# Patient Record
Sex: Female | Born: 1940
Health system: Southern US, Community
[De-identification: ages and names within clinical notes are randomized; demographics above are authoritative.]

## PROBLEM LIST (undated history)

## (undated) DIAGNOSIS — K219 Gastro-esophageal reflux disease without esophagitis: Secondary | ICD-10-CM

## (undated) DIAGNOSIS — B029 Zoster without complications: Secondary | ICD-10-CM

## (undated) DIAGNOSIS — M1990 Unspecified osteoarthritis, unspecified site: Secondary | ICD-10-CM

## (undated) DIAGNOSIS — C801 Malignant (primary) neoplasm, unspecified: Secondary | ICD-10-CM

## (undated) DIAGNOSIS — K5520 Angiodysplasia of colon without hemorrhage: Secondary | ICD-10-CM

## (undated) DIAGNOSIS — D509 Iron deficiency anemia, unspecified: Secondary | ICD-10-CM

## (undated) DIAGNOSIS — I10 Essential (primary) hypertension: Secondary | ICD-10-CM

## (undated) DIAGNOSIS — G610 Guillain-Barre syndrome: Secondary | ICD-10-CM

## (undated) DIAGNOSIS — K589 Irritable bowel syndrome without diarrhea: Secondary | ICD-10-CM

## (undated) HISTORY — PX: CHOLECYSTECTOMY: SHX55

## (undated) HISTORY — DX: Angiodysplasia of colon without hemorrhage: K55.20

## (undated) HISTORY — PX: CERVICAL POLYPECTOMY: SHX88

## (undated) HISTORY — DX: Iron deficiency anemia, unspecified: D50.9

## (undated) HISTORY — DX: Irritable bowel syndrome, unspecified: K58.9

## (undated) HISTORY — PX: BREAST EXCISIONAL BIOPSY: SUR124

---

## 1970-12-29 HISTORY — PX: TUBAL LIGATION: SHX77

## 1977-12-29 HISTORY — PX: ABDOMINAL HYSTERECTOMY: SHX81

## 1980-12-29 HISTORY — PX: BREAST SURGERY: SHX581

## 1985-12-29 HISTORY — PX: BLADDER SUSPENSION: SHX72

## 1998-03-15 ENCOUNTER — Ambulatory Visit (HOSPITAL_COMMUNITY): Admission: RE | Admit: 1998-03-15 | Discharge: 1998-03-15 | Payer: Self-pay | Admitting: *Deleted

## 1999-07-12 ENCOUNTER — Other Ambulatory Visit: Admission: RE | Admit: 1999-07-12 | Discharge: 1999-07-12 | Payer: Self-pay | Admitting: *Deleted

## 2000-04-30 ENCOUNTER — Ambulatory Visit (HOSPITAL_COMMUNITY): Admission: RE | Admit: 2000-04-30 | Discharge: 2000-04-30 | Payer: Self-pay | Admitting: *Deleted

## 2000-04-30 ENCOUNTER — Encounter: Payer: Self-pay | Admitting: *Deleted

## 2001-07-02 ENCOUNTER — Encounter: Payer: Self-pay | Admitting: *Deleted

## 2001-07-02 ENCOUNTER — Ambulatory Visit (HOSPITAL_COMMUNITY): Admission: RE | Admit: 2001-07-02 | Discharge: 2001-07-02 | Payer: Self-pay | Admitting: *Deleted

## 2002-08-16 ENCOUNTER — Encounter: Payer: Self-pay | Admitting: *Deleted

## 2002-08-16 ENCOUNTER — Ambulatory Visit (HOSPITAL_COMMUNITY): Admission: RE | Admit: 2002-08-16 | Discharge: 2002-08-16 | Payer: Self-pay | Admitting: *Deleted

## 2002-09-16 ENCOUNTER — Other Ambulatory Visit: Admission: RE | Admit: 2002-09-16 | Discharge: 2002-09-16 | Payer: Self-pay | Admitting: *Deleted

## 2003-09-15 ENCOUNTER — Encounter: Payer: Self-pay | Admitting: *Deleted

## 2003-09-15 ENCOUNTER — Ambulatory Visit (HOSPITAL_COMMUNITY): Admission: RE | Admit: 2003-09-15 | Discharge: 2003-09-15 | Payer: Self-pay | Admitting: *Deleted

## 2004-11-28 ENCOUNTER — Ambulatory Visit (HOSPITAL_COMMUNITY): Admission: RE | Admit: 2004-11-28 | Discharge: 2004-11-28 | Payer: Self-pay | Admitting: *Deleted

## 2005-12-16 ENCOUNTER — Ambulatory Visit (HOSPITAL_COMMUNITY): Admission: RE | Admit: 2005-12-16 | Discharge: 2005-12-16 | Payer: Self-pay | Admitting: *Deleted

## 2007-03-30 ENCOUNTER — Ambulatory Visit (HOSPITAL_COMMUNITY): Admission: RE | Admit: 2007-03-30 | Discharge: 2007-03-30 | Payer: Self-pay | Admitting: Family Medicine

## 2008-04-07 ENCOUNTER — Ambulatory Visit (HOSPITAL_COMMUNITY): Admission: RE | Admit: 2008-04-07 | Discharge: 2008-04-07 | Payer: Self-pay | Admitting: Family Medicine

## 2009-04-11 ENCOUNTER — Ambulatory Visit (HOSPITAL_COMMUNITY): Admission: RE | Admit: 2009-04-11 | Discharge: 2009-04-11 | Payer: Self-pay | Admitting: Family Medicine

## 2010-04-25 ENCOUNTER — Ambulatory Visit (HOSPITAL_COMMUNITY): Admission: RE | Admit: 2010-04-25 | Discharge: 2010-04-25 | Payer: Self-pay | Admitting: Family Medicine

## 2011-04-15 ENCOUNTER — Other Ambulatory Visit (HOSPITAL_COMMUNITY): Payer: Self-pay | Admitting: Family Medicine

## 2011-04-15 DIAGNOSIS — Z1231 Encounter for screening mammogram for malignant neoplasm of breast: Secondary | ICD-10-CM

## 2011-04-28 ENCOUNTER — Ambulatory Visit (HOSPITAL_COMMUNITY)
Admission: RE | Admit: 2011-04-28 | Discharge: 2011-04-28 | Disposition: A | Discharge status: Home / Self Care | Payer: Medicare Other | Source: Ambulatory Visit | Attending: Family Medicine | Admitting: Family Medicine

## 2011-04-28 DIAGNOSIS — Z1231 Encounter for screening mammogram for malignant neoplasm of breast: Secondary | ICD-10-CM | POA: Insufficient documentation

## 2012-04-21 ENCOUNTER — Other Ambulatory Visit (HOSPITAL_COMMUNITY): Payer: Self-pay | Admitting: Family Medicine

## 2012-04-21 DIAGNOSIS — Z1231 Encounter for screening mammogram for malignant neoplasm of breast: Secondary | ICD-10-CM

## 2012-05-06 ENCOUNTER — Ambulatory Visit
Admission: RE | Admit: 2012-05-06 | Discharge: 2012-05-06 | Disposition: A | Discharge status: Home / Self Care | Payer: Medicare Other | Source: Ambulatory Visit | Attending: Family Medicine | Admitting: Family Medicine

## 2012-05-06 DIAGNOSIS — Z1231 Encounter for screening mammogram for malignant neoplasm of breast: Secondary | ICD-10-CM

## 2012-10-15 ENCOUNTER — Ambulatory Visit (INDEPENDENT_AMBULATORY_CARE_PROVIDER_SITE_OTHER): Payer: Medicare Other | Admitting: Internal Medicine

## 2012-10-15 DIAGNOSIS — Z789 Other specified health status: Secondary | ICD-10-CM

## 2012-10-15 MED ORDER — AZITHROMYCIN 500 MG PO TABS: 500.0000 mg | ORAL_TABLET | Freq: Every day | ORAL | Status: DC

## 2012-10-15 MED ORDER — ATOVAQUONE-PROGUANIL HCL 250-100 MG PO TABS: 1.0000 | ORAL_TABLET | Freq: Every day | ORAL | Status: DC

## 2012-10-15 NOTE — Progress Notes (Signed)
  Subjective:    Patient ID: Taylor Kent, female    DOB: Dec 20, 1941, 71 y.o.   MRN: [id number]  HPI 71 yo Female with no significant past medical history, she is going to [place] with her daughter , to [place], [place], doing [place] 3 day safari, trip will be 9 days long.  Flying through [place].  All: sulfa - hives  Previously well traveled, [place], [place], [place], [place], [place]  Received flu vaccine this year  Review of Systems     Objective:   Physical Exam        Assessment & Plan:  Pre-travel counseling: we discussed malaria prevention with use of premethrin and deet spray   Traveller's diarrhea= gave rx for azithro, and instructions how to use incase of TD. Also recommended pepto bismal; gave handout sheet   Malaria proph = gave malarone rx #18 . Instructed to start 2 days prior to going into area until 7 days after.   Pre-travel vaccines = did not need anything, recommended to get typhoid if they do more adventurous travel

## 2012-10-18 ENCOUNTER — Other Ambulatory Visit: Payer: Self-pay | Admitting: Internal Medicine

## 2012-10-19 ENCOUNTER — Other Ambulatory Visit: Payer: Self-pay | Admitting: *Deleted

## 2012-10-19 DIAGNOSIS — Z789 Other specified health status: Secondary | ICD-10-CM

## 2012-10-19 MED ORDER — ATOVAQUONE-PROGUANIL HCL 250-100 MG PO TABS: 1.0000 | ORAL_TABLET | Freq: Every day | ORAL | Status: DC

## 2012-10-19 MED ORDER — AZITHROMYCIN 500 MG PO TABS: 500.0000 mg | ORAL_TABLET | Freq: Every day | ORAL | Status: DC

## 2013-04-18 ENCOUNTER — Encounter: Payer: Self-pay | Admitting: *Deleted

## 2013-06-27 ENCOUNTER — Other Ambulatory Visit (HOSPITAL_COMMUNITY): Payer: Self-pay | Admitting: Family Medicine

## 2013-06-27 DIAGNOSIS — Z1231 Encounter for screening mammogram for malignant neoplasm of breast: Secondary | ICD-10-CM

## 2013-06-28 ENCOUNTER — Ambulatory Visit (HOSPITAL_COMMUNITY)
Admission: RE | Admit: 2013-06-28 | Discharge: 2013-06-28 | Disposition: A | Discharge status: Home / Self Care | Payer: Medicare Other | Source: Ambulatory Visit | Attending: Family Medicine | Admitting: Family Medicine

## 2013-06-28 DIAGNOSIS — Z1231 Encounter for screening mammogram for malignant neoplasm of breast: Secondary | ICD-10-CM

## 2013-11-03 ENCOUNTER — Other Ambulatory Visit: Payer: Self-pay

## 2013-12-29 DIAGNOSIS — B029 Zoster without complications: Secondary | ICD-10-CM

## 2013-12-29 DIAGNOSIS — G610 Guillain-Barre syndrome: Secondary | ICD-10-CM

## 2013-12-29 HISTORY — DX: Guillain-Barre syndrome: G61.0

## 2013-12-29 HISTORY — DX: Zoster without complications: B02.9

## 2014-05-31 ENCOUNTER — Other Ambulatory Visit (HOSPITAL_COMMUNITY): Payer: Self-pay | Admitting: Family Medicine

## 2014-05-31 DIAGNOSIS — Z1231 Encounter for screening mammogram for malignant neoplasm of breast: Secondary | ICD-10-CM

## 2014-06-29 ENCOUNTER — Ambulatory Visit (HOSPITAL_COMMUNITY)
Admission: RE | Admit: 2014-06-29 | Discharge: 2014-06-29 | Disposition: A | Discharge status: Home / Self Care | Payer: Medicare Other | Source: Ambulatory Visit | Attending: Family Medicine | Admitting: Family Medicine

## 2014-06-29 DIAGNOSIS — Z1231 Encounter for screening mammogram for malignant neoplasm of breast: Secondary | ICD-10-CM | POA: Insufficient documentation

## 2015-06-18 ENCOUNTER — Other Ambulatory Visit (HOSPITAL_COMMUNITY): Payer: Self-pay | Admitting: Family Medicine

## 2015-06-18 DIAGNOSIS — Z1231 Encounter for screening mammogram for malignant neoplasm of breast: Secondary | ICD-10-CM

## 2015-06-28 DIAGNOSIS — H43393 Other vitreous opacities, bilateral: Secondary | ICD-10-CM | POA: Diagnosis not present

## 2015-06-28 DIAGNOSIS — H52223 Regular astigmatism, bilateral: Secondary | ICD-10-CM | POA: Diagnosis not present

## 2015-06-28 DIAGNOSIS — H5203 Hypermetropia, bilateral: Secondary | ICD-10-CM | POA: Diagnosis not present

## 2015-06-28 DIAGNOSIS — H2513 Age-related nuclear cataract, bilateral: Secondary | ICD-10-CM | POA: Diagnosis not present

## 2015-06-28 DIAGNOSIS — H524 Presbyopia: Secondary | ICD-10-CM | POA: Diagnosis not present

## 2015-06-28 DIAGNOSIS — H35372 Puckering of macula, left eye: Secondary | ICD-10-CM | POA: Diagnosis not present

## 2015-07-03 ENCOUNTER — Ambulatory Visit (HOSPITAL_COMMUNITY)
Admission: RE | Admit: 2015-07-03 | Discharge: 2015-07-03 | Disposition: A | Discharge status: Home / Self Care | Payer: Medicare Other | Source: Ambulatory Visit | Attending: Family Medicine | Admitting: Family Medicine

## 2015-07-03 ENCOUNTER — Other Ambulatory Visit (HOSPITAL_COMMUNITY): Payer: Self-pay | Admitting: Family Medicine

## 2015-07-03 DIAGNOSIS — Z1231 Encounter for screening mammogram for malignant neoplasm of breast: Secondary | ICD-10-CM

## 2015-07-09 DIAGNOSIS — E559 Vitamin D deficiency, unspecified: Secondary | ICD-10-CM | POA: Diagnosis not present

## 2015-07-09 DIAGNOSIS — E039 Hypothyroidism, unspecified: Secondary | ICD-10-CM | POA: Diagnosis not present

## 2015-07-09 DIAGNOSIS — I10 Essential (primary) hypertension: Secondary | ICD-10-CM | POA: Diagnosis not present

## 2015-07-09 DIAGNOSIS — Z79899 Other long term (current) drug therapy: Secondary | ICD-10-CM | POA: Diagnosis not present

## 2015-07-09 DIAGNOSIS — M545 Low back pain: Secondary | ICD-10-CM | POA: Diagnosis not present

## 2015-07-09 DIAGNOSIS — E780 Pure hypercholesterolemia: Secondary | ICD-10-CM | POA: Diagnosis not present

## 2015-07-09 DIAGNOSIS — R5383 Other fatigue: Secondary | ICD-10-CM | POA: Diagnosis not present

## 2015-07-13 ENCOUNTER — Other Ambulatory Visit (HOSPITAL_COMMUNITY): Payer: Self-pay | Admitting: Internal Medicine

## 2015-07-13 DIAGNOSIS — G729 Myopathy, unspecified: Secondary | ICD-10-CM

## 2015-07-13 DIAGNOSIS — R29898 Other symptoms and signs involving the musculoskeletal system: Secondary | ICD-10-CM

## 2015-07-23 ENCOUNTER — Ambulatory Visit (HOSPITAL_COMMUNITY)
Admission: RE | Admit: 2015-07-23 | Discharge: 2015-07-23 | Disposition: A | Discharge status: Home / Self Care | Payer: Medicare Other | Source: Ambulatory Visit | Attending: Internal Medicine | Admitting: Internal Medicine

## 2015-07-23 ENCOUNTER — Other Ambulatory Visit (HOSPITAL_COMMUNITY): Payer: Self-pay | Admitting: Internal Medicine

## 2015-07-23 DIAGNOSIS — R0989 Other specified symptoms and signs involving the circulatory and respiratory systems: Secondary | ICD-10-CM | POA: Diagnosis not present

## 2015-07-23 DIAGNOSIS — S83282A Other tear of lateral meniscus, current injury, left knee, initial encounter: Secondary | ICD-10-CM | POA: Diagnosis not present

## 2015-07-23 DIAGNOSIS — M25561 Pain in right knee: Secondary | ICD-10-CM

## 2015-07-23 DIAGNOSIS — M47816 Spondylosis without myelopathy or radiculopathy, lumbar region: Secondary | ICD-10-CM | POA: Diagnosis not present

## 2015-07-23 DIAGNOSIS — M25462 Effusion, left knee: Secondary | ICD-10-CM | POA: Insufficient documentation

## 2015-07-23 DIAGNOSIS — M67461 Ganglion, right knee: Secondary | ICD-10-CM | POA: Insufficient documentation

## 2015-07-23 DIAGNOSIS — S83271A Complex tear of lateral meniscus, current injury, right knee, initial encounter: Secondary | ICD-10-CM | POA: Diagnosis not present

## 2015-07-23 DIAGNOSIS — M1711 Unilateral primary osteoarthritis, right knee: Secondary | ICD-10-CM | POA: Diagnosis not present

## 2015-07-23 DIAGNOSIS — M170 Bilateral primary osteoarthritis of knee: Secondary | ICD-10-CM | POA: Diagnosis not present

## 2015-07-23 DIAGNOSIS — R9389 Abnormal findings on diagnostic imaging of other specified body structures: Secondary | ICD-10-CM

## 2015-07-23 DIAGNOSIS — M5136 Other intervertebral disc degeneration, lumbar region: Secondary | ICD-10-CM | POA: Diagnosis not present

## 2015-07-23 DIAGNOSIS — M7642 Tibial collateral bursitis [Pellegrini-Stieda], left leg: Secondary | ICD-10-CM | POA: Diagnosis not present

## 2015-07-23 DIAGNOSIS — M479 Spondylosis, unspecified: Secondary | ICD-10-CM | POA: Insufficient documentation

## 2015-07-23 DIAGNOSIS — M6752 Plica syndrome, left knee: Secondary | ICD-10-CM | POA: Insufficient documentation

## 2015-07-23 DIAGNOSIS — M7121 Synovial cyst of popliteal space [Baker], right knee: Secondary | ICD-10-CM | POA: Insufficient documentation

## 2015-07-23 DIAGNOSIS — X58XXXA Exposure to other specified factors, initial encounter: Secondary | ICD-10-CM | POA: Insufficient documentation

## 2015-07-23 DIAGNOSIS — M25461 Effusion, right knee: Secondary | ICD-10-CM | POA: Insufficient documentation

## 2015-07-23 DIAGNOSIS — M5126 Other intervertebral disc displacement, lumbar region: Secondary | ICD-10-CM | POA: Diagnosis not present

## 2015-07-23 DIAGNOSIS — S83242A Other tear of medial meniscus, current injury, left knee, initial encounter: Secondary | ICD-10-CM | POA: Insufficient documentation

## 2015-07-23 DIAGNOSIS — M419 Scoliosis, unspecified: Secondary | ICD-10-CM | POA: Diagnosis not present

## 2015-07-23 DIAGNOSIS — R52 Pain, unspecified: Secondary | ICD-10-CM

## 2015-07-23 DIAGNOSIS — G729 Myopathy, unspecified: Secondary | ICD-10-CM | POA: Diagnosis not present

## 2015-07-23 DIAGNOSIS — M25562 Pain in left knee: Secondary | ICD-10-CM

## 2015-07-23 DIAGNOSIS — M23322 Other meniscus derangements, posterior horn of medial meniscus, left knee: Secondary | ICD-10-CM | POA: Diagnosis not present

## 2015-07-23 DIAGNOSIS — R29898 Other symptoms and signs involving the musculoskeletal system: Secondary | ICD-10-CM

## 2015-07-23 DIAGNOSIS — M23352 Other meniscus derangements, posterior horn of lateral meniscus, left knee: Secondary | ICD-10-CM | POA: Diagnosis not present

## 2015-07-25 DIAGNOSIS — M5116 Intervertebral disc disorders with radiculopathy, lumbar region: Secondary | ICD-10-CM | POA: Diagnosis not present

## 2015-07-25 DIAGNOSIS — M9903 Segmental and somatic dysfunction of lumbar region: Secondary | ICD-10-CM | POA: Diagnosis not present

## 2015-07-30 DIAGNOSIS — M9903 Segmental and somatic dysfunction of lumbar region: Secondary | ICD-10-CM | POA: Diagnosis not present

## 2015-07-30 DIAGNOSIS — M5116 Intervertebral disc disorders with radiculopathy, lumbar region: Secondary | ICD-10-CM | POA: Diagnosis not present

## 2015-08-01 DIAGNOSIS — M5116 Intervertebral disc disorders with radiculopathy, lumbar region: Secondary | ICD-10-CM | POA: Diagnosis not present

## 2015-08-01 DIAGNOSIS — M9903 Segmental and somatic dysfunction of lumbar region: Secondary | ICD-10-CM | POA: Diagnosis not present

## 2015-08-06 DIAGNOSIS — M5116 Intervertebral disc disorders with radiculopathy, lumbar region: Secondary | ICD-10-CM | POA: Diagnosis not present

## 2015-08-06 DIAGNOSIS — M9903 Segmental and somatic dysfunction of lumbar region: Secondary | ICD-10-CM | POA: Diagnosis not present

## 2015-08-08 DIAGNOSIS — M5116 Intervertebral disc disorders with radiculopathy, lumbar region: Secondary | ICD-10-CM | POA: Diagnosis not present

## 2015-08-08 DIAGNOSIS — M9903 Segmental and somatic dysfunction of lumbar region: Secondary | ICD-10-CM | POA: Diagnosis not present

## 2015-12-12 DIAGNOSIS — E7800 Pure hypercholesterolemia, unspecified: Secondary | ICD-10-CM | POA: Diagnosis not present

## 2015-12-12 DIAGNOSIS — M545 Low back pain: Secondary | ICD-10-CM | POA: Diagnosis not present

## 2015-12-12 DIAGNOSIS — E039 Hypothyroidism, unspecified: Secondary | ICD-10-CM | POA: Diagnosis not present

## 2015-12-12 DIAGNOSIS — Z79899 Other long term (current) drug therapy: Secondary | ICD-10-CM | POA: Diagnosis not present

## 2015-12-12 DIAGNOSIS — I10 Essential (primary) hypertension: Secondary | ICD-10-CM | POA: Diagnosis not present

## 2015-12-12 DIAGNOSIS — E2839 Other primary ovarian failure: Secondary | ICD-10-CM | POA: Diagnosis not present

## 2015-12-12 DIAGNOSIS — E559 Vitamin D deficiency, unspecified: Secondary | ICD-10-CM | POA: Diagnosis not present

## 2015-12-12 DIAGNOSIS — R5383 Other fatigue: Secondary | ICD-10-CM | POA: Diagnosis not present

## 2016-05-12 DIAGNOSIS — M25561 Pain in right knee: Secondary | ICD-10-CM | POA: Diagnosis not present

## 2016-05-12 DIAGNOSIS — M25562 Pain in left knee: Secondary | ICD-10-CM | POA: Diagnosis not present

## 2016-07-02 ENCOUNTER — Other Ambulatory Visit: Payer: Self-pay | Admitting: Internal Medicine

## 2016-07-02 DIAGNOSIS — Z1231 Encounter for screening mammogram for malignant neoplasm of breast: Secondary | ICD-10-CM

## 2016-07-04 ENCOUNTER — Ambulatory Visit
Admission: RE | Admit: 2016-07-04 | Discharge: 2016-07-04 | Disposition: A | Discharge status: Home / Self Care | Payer: Medicare Other | Source: Ambulatory Visit | Attending: Internal Medicine | Admitting: Internal Medicine

## 2016-07-04 DIAGNOSIS — Z1231 Encounter for screening mammogram for malignant neoplasm of breast: Secondary | ICD-10-CM

## 2016-08-26 DIAGNOSIS — M1711 Unilateral primary osteoarthritis, right knee: Secondary | ICD-10-CM | POA: Diagnosis not present

## 2016-12-05 DIAGNOSIS — Z79899 Other long term (current) drug therapy: Secondary | ICD-10-CM | POA: Diagnosis not present

## 2016-12-05 DIAGNOSIS — M545 Low back pain: Secondary | ICD-10-CM | POA: Diagnosis not present

## 2016-12-05 DIAGNOSIS — R5383 Other fatigue: Secondary | ICD-10-CM | POA: Diagnosis not present

## 2016-12-05 DIAGNOSIS — I10 Essential (primary) hypertension: Secondary | ICD-10-CM | POA: Diagnosis not present

## 2016-12-05 DIAGNOSIS — E7800 Pure hypercholesterolemia, unspecified: Secondary | ICD-10-CM | POA: Diagnosis not present

## 2016-12-05 DIAGNOSIS — E039 Hypothyroidism, unspecified: Secondary | ICD-10-CM | POA: Diagnosis not present

## 2016-12-05 DIAGNOSIS — E2839 Other primary ovarian failure: Secondary | ICD-10-CM | POA: Diagnosis not present

## 2016-12-05 DIAGNOSIS — E559 Vitamin D deficiency, unspecified: Secondary | ICD-10-CM | POA: Diagnosis not present

## 2016-12-19 IMAGING — MR MR KNEE*L* W/O CM
4 of 5 series · 18 of 40 positions shown · non-contrast
Comparison: None.

CLINICAL DATA: Bilateral lower extremity weakness for 1 year. Pain
in both knees.

EXAM:
MRI OF THE LEFT KNEE WITHOUT CONTRAST
TECHNIQUE: Multiplanar, multisequence MR imaging of the knee was performed. No
intravenous contrast was administered.

[Series 4: PD fat-sat · axial · 3.0mm · 0.31mm/px · z∈[-35,+105]mm · 9 of 41 slices shown (1 of 3)]
[im 1/41]
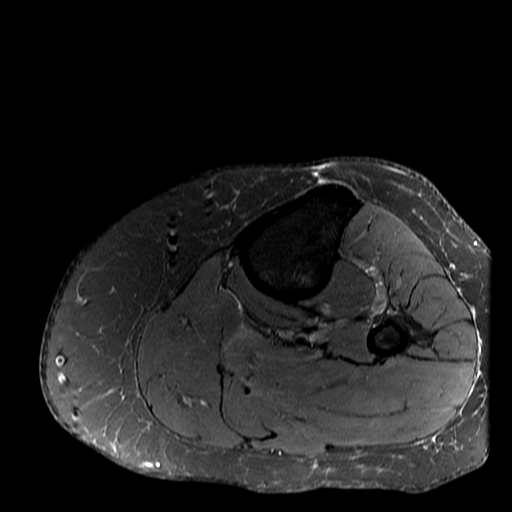
[im 6/41]
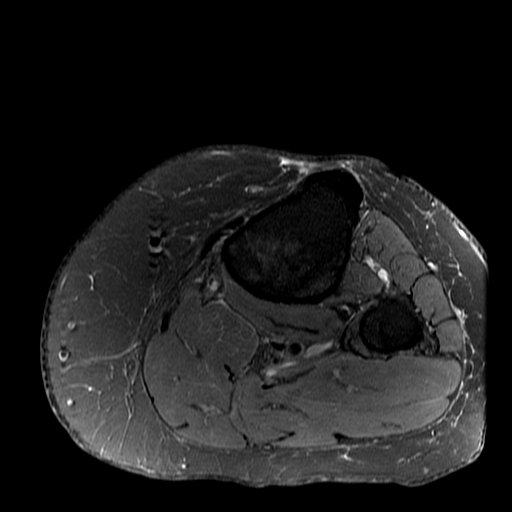
[im 11/41]
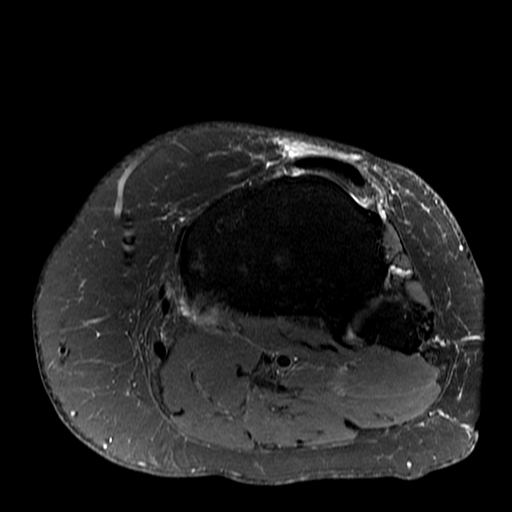
[im 16/41]
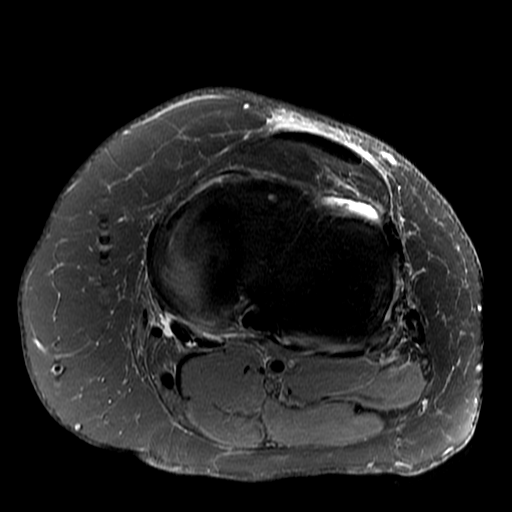
[im 21/41]
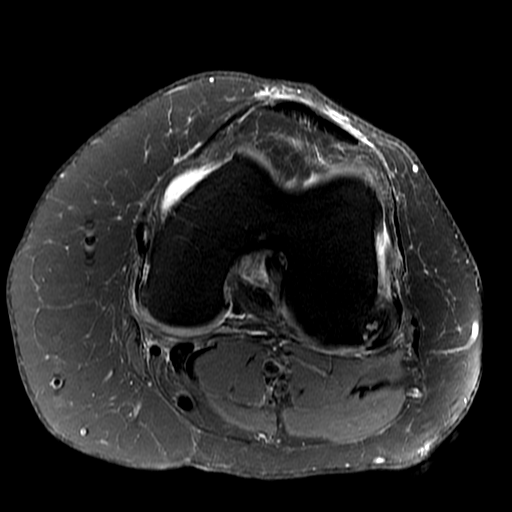
[im 26/41]
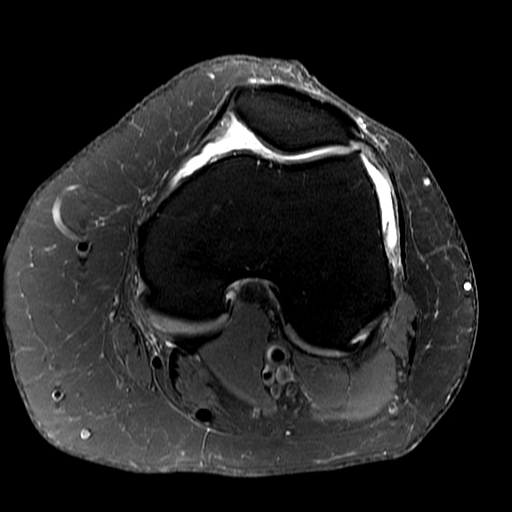
[im 31/41]
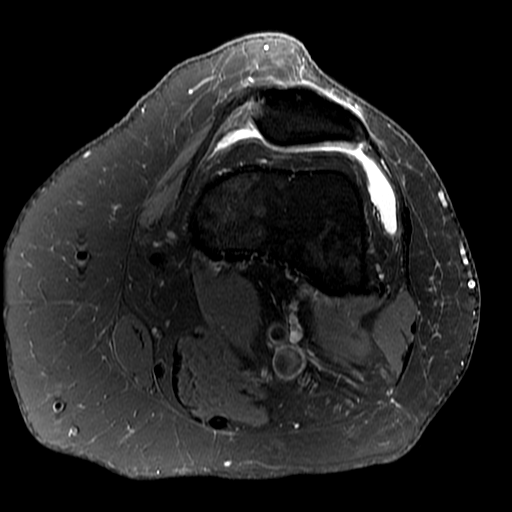
[im 36/41]
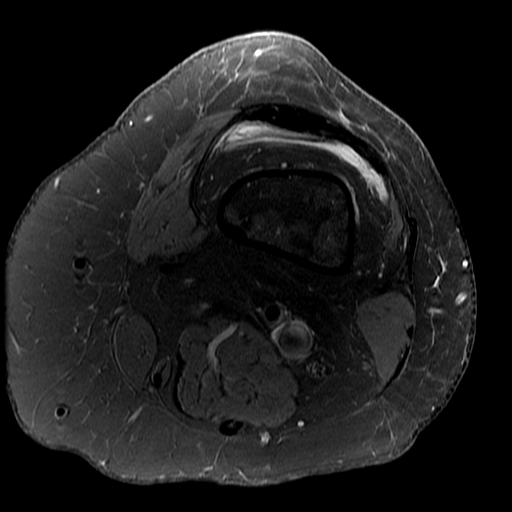
[im 41/41]
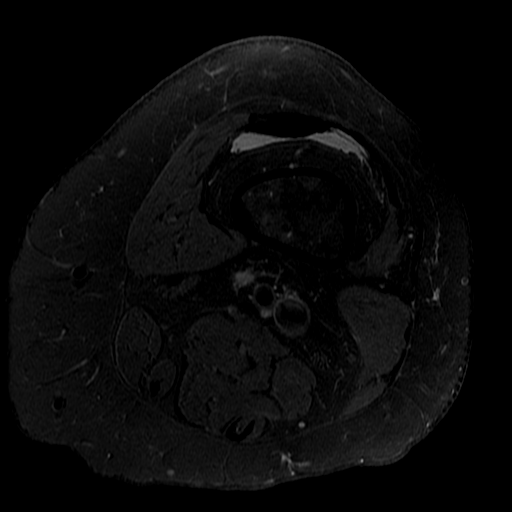

[Series 6: PD fat-sat · coronal · 3.0mm · 0.31mm/px · 3 of 42 slices shown (2 of 3)]
[im 6/42]
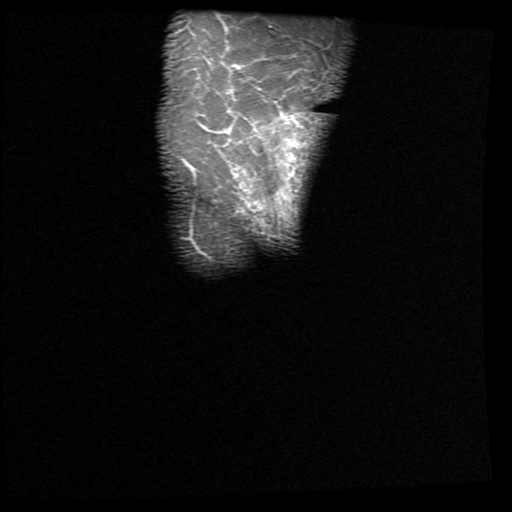
[im 24/42]
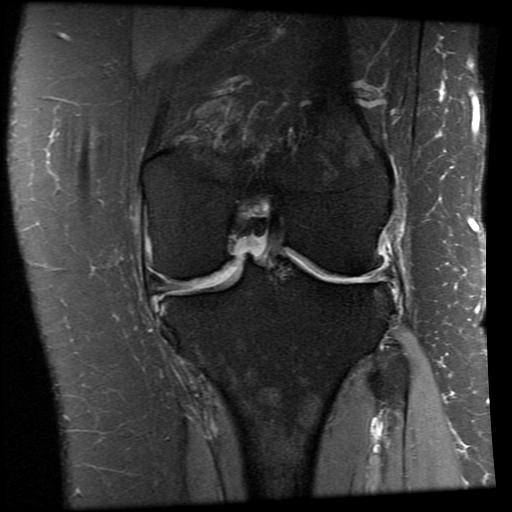
[im 36/42]
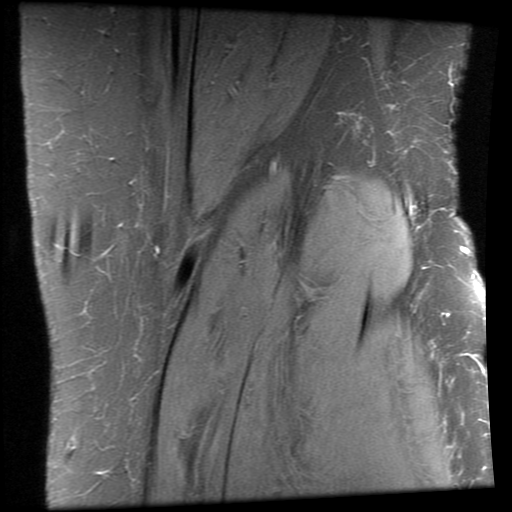

[Series 7: PD fat-sat · sagittal · 3.0mm · 0.29mm/px · 3 of 34 slices shown (3 of 3)]
[im 6/34]
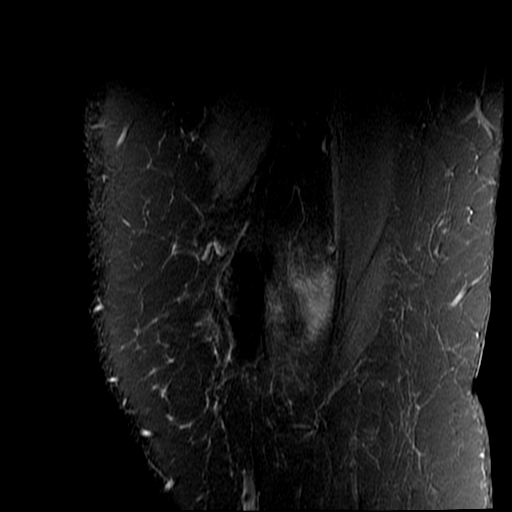
[im 17/34]
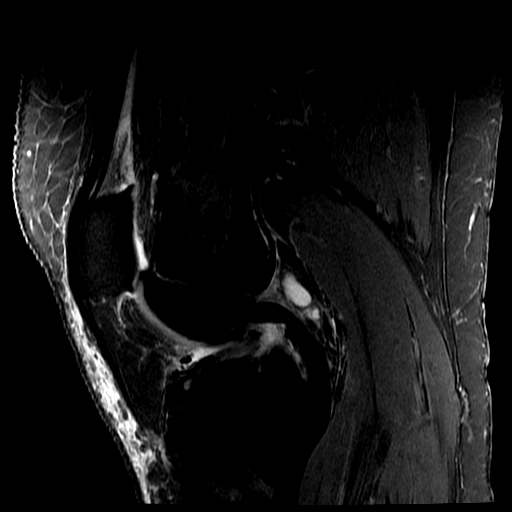
[im 28/34]
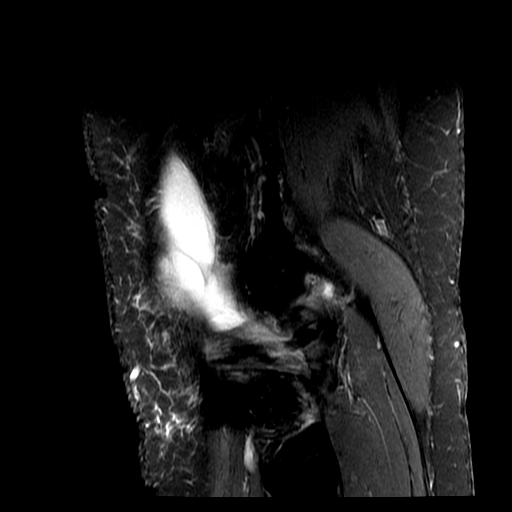

[Series 8: T2 fat-sat · coronal · 3.0mm · 0.31mm/px · 3 of 42 slices shown]
[im 6/42]
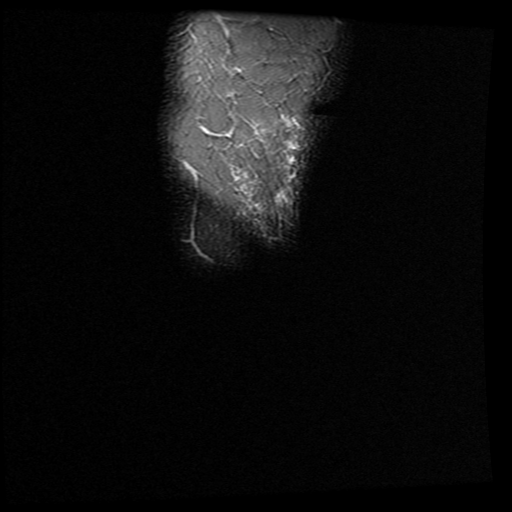
[im 24/42]
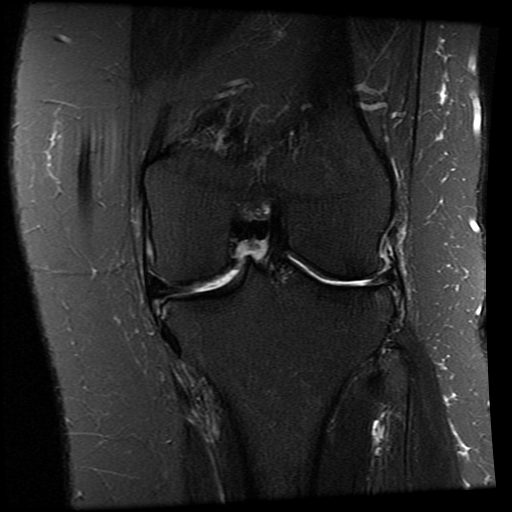
[im 36/42]
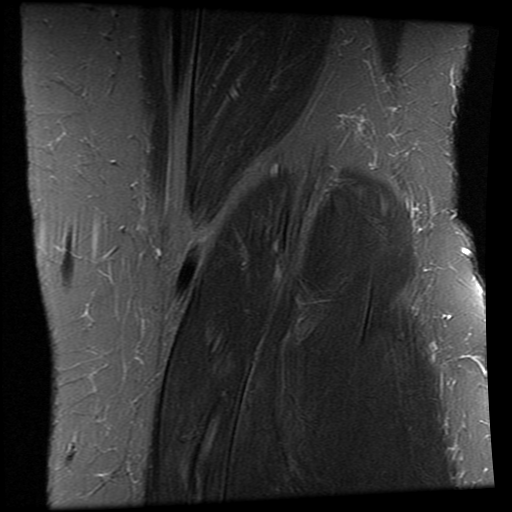

[18 of 40 positions shown; findings below may reference images not displayed]

FINDINGS: MENISCI

Medial meniscus: Radial tear posterior horn medial meniscus near the
meniscal root with irregular free edge component extending
longitudinally in the adjacent segment of the posterior horn to the
level of the meniscal midbody.

Lateral meniscus: Radial tear of the posterior horn lateral meniscus
with diminutive adjacent posterior horn and midbody segments, and
inferior surface tearing in the anterior horn lateral meniscus.

LIGAMENTS

Cruciates:  Unremarkable

Collaterals: Proximal popliteus tendinopathy. Partial tearing of the
medial collateral ligament proximally.

CARTILAGE

Patellofemoral: Full-thickness loss of articular cartilage along a
significant portion of the patellar articular surface, with
prominent loss of articular cartilage in the femoral trochlear
groove.

Medial: Severe full-thickness loss of much of the articular
cartilage in the medial compartment.

Lateral: Moderate chondral thinning with moderate chondral
heterogeneity.

Joint:  Small knee effusion.  Superior plica.

Popliteal Fossa: Semimembranosus-tibial collateral ligament
bursitis.

Extensor Mechanism: Subtle linear increased signal in the distal
quadriceps tendon is likely incidental but in the appropriate
clinical setting could represent a quadriceps sprain.

Bones: Confluent degenerative subcortical cystic lesions posteriorly
along the medial tibial plateau. Tricompartmental spurring. Focal
subcortical sclerosis along the lateral femoral condyle, likely
degenerative.
IMPRESSION: 1. Radial tear posterior horn medial meniscus with longitudinal
extension along the free edge.
2. Radial tear posterior horn lateral meniscus with diminutive
adjacent posterior horn and midbody, and inferior surface tearing in
the anterior horn lateral meniscus.
3. Partially torn MCL proximally.
4. Proximal popliteus tendinopathy.
5. Severe osteoarthritis.
6. Small knee effusion with superior plica.
7. Mild semimembranosus -tibial collateral ligament bursitis.
8. Subtle linear increased signal in the distal quadriceps tendon is
likely incidental but in the appropriate clinical setting could
represent a quadriceps sprain.

## 2017-02-06 DIAGNOSIS — M25561 Pain in right knee: Secondary | ICD-10-CM | POA: Diagnosis not present

## 2017-02-25 DIAGNOSIS — M9903 Segmental and somatic dysfunction of lumbar region: Secondary | ICD-10-CM | POA: Diagnosis not present

## 2017-02-25 DIAGNOSIS — M5116 Intervertebral disc disorders with radiculopathy, lumbar region: Secondary | ICD-10-CM | POA: Diagnosis not present

## 2017-03-23 DIAGNOSIS — M25561 Pain in right knee: Secondary | ICD-10-CM | POA: Diagnosis not present

## 2017-03-23 NOTE — H&P (Signed)
TOTAL KNEE ADMISSION H&P  Patient is being admitted for right total knee arthroplasty.  Subjective:  Chief Complaint:right knee pain.  HPI: Taylor Kent, 76 y.o. female, has a history of pain and functional disability in the right knee due to arthritis and has failed non-surgical conservative treatments for greater than 12 weeks to includeNSAID's and/or analgesics and corticosteriod injections.  Onset of symptoms was gradual, starting 6 years ago with gradually worsening course since that time. The patient noted no past surgery on the right knee(s).  Patient currently rates pain in the right knee(s) at 2 out of 10 with activity. Patient has night pain, worsening of pain with activity and weight bearing and pain that interferes with activities of daily living.  Patient has evidence of subchondral sclerosis and joint space narrowing by imaging studies. There is no active infection.  There are no active problems to display for this patient.  No past medical history on file.  No past surgical history on file.  No prescriptions prior to admission.   Allergies  Allergen Reactions  . Sulfa Antibiotics Hives    Social History  Substance Use Topics  . Smoking status: Not on file  . Smokeless tobacco: Not on file  . Alcohol use Not on file    No family history on file.   Review of Systems  Constitutional: Negative.   HENT: Negative.   Eyes: Negative.   Respiratory: Negative.   Cardiovascular: Negative.   Gastrointestinal: Negative.   Genitourinary: Negative.   Musculoskeletal: Positive for joint pain.  Skin: Negative.   Neurological: Negative.   Endo/Heme/Allergies: Negative.   Psychiatric/Behavioral: Negative.     Objective:  Physical Exam  Constitutional: She is oriented to person, place, and time. She appears well-developed and well-nourished.  HENT:  Head: Normocephalic and atraumatic.  Eyes: EOM are normal. Pupils are equal, round, and reactive to light.  Neck: Neck  supple.  Cardiovascular: Normal rate and regular rhythm.   Respiratory: Effort normal and breath sounds normal.  GI: Soft. Bowel sounds are normal.  Musculoskeletal:  Antalgic gait on the right where she has valgus thrust.  Both knees have considerable patellofemoral crepitus, but again, little symptoms on the left.  Right knee valgus beyond 12 degrees, a little bit correctable.  She still has full extension, better than 110 degrees of flexion.  Stable ligaments.  Neurovascularly intact distally.  Negative log roll of both hips.  Negative straight leg raise, both sides.    Neurological: She is alert and oriented to person, place, and time.  Skin: Skin is warm and dry.  Psychiatric: She has a normal mood and affect. Her behavior is normal. Judgment and thought content normal.    Vital signs in last 24 hours:    Labs:   There is no height or weight on file to calculate BMI.   Imaging Review Plain radiographs demonstrate severe degenerative joint disease of the right knee(s). The overall alignment ismild valgus. The bone quality appears to be fair for age and reported activity level.  Assessment/Plan:  End stage arthritis, right knee   The patient history, physical examination, clinical judgment of the provider and imaging studies are consistent with end stage degenerative joint disease of the right knee(s) and total knee arthroplasty is deemed medically necessary. The treatment options including medical management, injection therapy arthroscopy and arthroplasty were discussed at length. The risks and benefits of total knee arthroplasty were presented and reviewed. The risks due to aseptic loosening, infection, stiffness, patella tracking problems,  thromboembolic complications and other imponderables were discussed. The patient acknowledged the explanation, agreed to proceed with the plan and consent was signed. Patient is being admitted for inpatient treatment for surgery, pain control, PT,  OT, prophylactic antibiotics, VTE prophylaxis, progressive ambulation and ADL's and discharge planning. The patient is planning to be discharged home with home health services

## 2017-03-27 NOTE — Pre-Procedure Instructions (Addendum)
    Taylor Kent  03/27/2017      [facility] # [place], [place] [address] Phone: [phone] Fax: [phone]  [facility] [place], [place] - [address] [place] [address] [place] [address] Phone: [phone] Fax: [phone]   Your procedure is scheduled on Wed. Apr. 11 at 145 PM. Report to [place] [facility] at 1145 AM.  Call this number if you have problems the morning of surgery:[phone]   Remember:  Do not eat food or drink liquids after midnight.  Take these medicines the morning of surgery with A SIP OF WATER acetaminophen (tylenol)-if needed, esomeprazole (nexium), estrogens-methylest (Estratest), methocarbamol(roboxin), montelukast (singulair).   Do not wear jewelry, make-up or nail polish.  Do not wear lotions, powders, or perfumes, or deoderant.  Do not shave 48 hours prior to surgery.  Do not bring valuables to the hospital.  [facility] is not responsible for any belongings or valuables.  Contacts, dentures or bridgework may not be worn into surgery.  Leave your suitcase in the car.  After surgery it may be brought to your room.  For patients admitted to the hospital, discharge time will be determined by your treatment team.  Patients discharged the day of surgery will not be allowed to drive home.   Special instructions:  See attached  Please read over the following fact sheets that you were given. Pain Booklet, Coughing and Deep Breathing, MRSA Information and Surgical Site Infection Prevention

## 2017-03-30 ENCOUNTER — Encounter (HOSPITAL_COMMUNITY)
Admission: RE | Admit: 2017-03-30 | Discharge: 2017-03-30 | Disposition: A | Discharge status: Home / Self Care | Payer: Medicare Other | Source: Ambulatory Visit | Attending: Orthopedic Surgery | Admitting: Orthopedic Surgery

## 2017-03-30 ENCOUNTER — Other Ambulatory Visit: Payer: Self-pay

## 2017-03-30 ENCOUNTER — Encounter (HOSPITAL_COMMUNITY): Payer: Self-pay

## 2017-03-30 ENCOUNTER — Ambulatory Visit (HOSPITAL_COMMUNITY)
Admission: RE | Admit: 2017-03-30 | Discharge: 2017-03-30 | Disposition: A | Discharge status: Home / Self Care | Payer: Medicare Other | Source: Ambulatory Visit | Attending: Anesthesiology | Admitting: Anesthesiology

## 2017-03-30 DIAGNOSIS — M1711 Unilateral primary osteoarthritis, right knee: Secondary | ICD-10-CM | POA: Diagnosis not present

## 2017-03-30 DIAGNOSIS — Z01810 Encounter for preprocedural cardiovascular examination: Secondary | ICD-10-CM | POA: Diagnosis not present

## 2017-03-30 DIAGNOSIS — Z01812 Encounter for preprocedural laboratory examination: Secondary | ICD-10-CM | POA: Diagnosis not present

## 2017-03-30 DIAGNOSIS — Z01818 Encounter for other preprocedural examination: Secondary | ICD-10-CM

## 2017-03-30 DIAGNOSIS — I10 Essential (primary) hypertension: Secondary | ICD-10-CM | POA: Diagnosis not present

## 2017-03-30 HISTORY — DX: Gastro-esophageal reflux disease without esophagitis: K21.9

## 2017-03-30 HISTORY — DX: Unspecified osteoarthritis, unspecified site: M19.90

## 2017-03-30 HISTORY — DX: Zoster without complications: B02.9

## 2017-03-30 HISTORY — DX: Guillain-Barre syndrome: G61.0

## 2017-03-30 HISTORY — DX: Malignant (primary) neoplasm, unspecified: C80.1

## 2017-03-30 HISTORY — DX: Essential (primary) hypertension: I10

## 2017-03-30 LAB — BASIC METABOLIC PANEL
Anion gap: 9 (ref 5–15)
BUN: 21 mg/dL — AB (ref 6–20)
CHLORIDE: 105 mmol/L (ref 101–111)
CO2: 23 mmol/L (ref 22–32)
CREATININE: 0.9 mg/dL (ref 0.44–1.00)
Calcium: 9.2 mg/dL (ref 8.9–10.3)
GFR calc Af Amer: >60 mL/min (ref >60)
GFR calc non Af Amer: >60 mL/min (ref >60)
Glucose, Bld: 101 mg/dL — ABNORMAL HIGH (ref 65–99)
Potassium: 3.8 mmol/L (ref 3.5–5.1)
SODIUM: 137 mmol/L (ref 135–145)

## 2017-03-30 LAB — CBC
HCT: 39.6 % (ref 36.0–46.0)
Hemoglobin: 12.7 g/dL (ref 12.0–15.0)
MCH: 26.7 pg (ref 26.0–34.0)
MCHC: 32.1 g/dL (ref 30.0–36.0)
MCV: 83.2 fL (ref 78.0–100.0)
PLATELETS: 308 10*3/uL (ref 150–400)
RBC: 4.76 MIL/uL (ref 3.87–5.11)
RDW: 14.5 % (ref 11.5–15.5)
WBC: 9.5 10*3/uL (ref 4.0–10.5)

## 2017-03-30 LAB — NO BLOOD PRODUCTS

## 2017-03-30 LAB — SURGICAL PCR SCREEN
MRSA, PCR: NEGATIVE
Staphylococcus aureus: NEGATIVE

## 2017-03-30 NOTE — Progress Notes (Signed)
Taylor Kent denies chest pain or shortness of breath.  Patient added to consent "mininimally invasive - with titanium joint".  I sent Dr D. Percell Miller an in box message.  Patient is refusing type and screen for this surgery, ' there is no reason I should need a transfusion, they knee and lots of big surgies like heart  surgery bloodless.' Dr Percell Miller notified.  Patient does have a health care power of attorney , nut not a living will.  POA is her daughter, Taylor Kent.    I notified Dr Percell Miller of the above.

## 2017-03-31 DIAGNOSIS — M25561 Pain in right knee: Secondary | ICD-10-CM | POA: Diagnosis not present

## 2017-04-02 DIAGNOSIS — M5116 Intervertebral disc disorders with radiculopathy, lumbar region: Secondary | ICD-10-CM | POA: Diagnosis not present

## 2017-04-02 DIAGNOSIS — M9903 Segmental and somatic dysfunction of lumbar region: Secondary | ICD-10-CM | POA: Diagnosis not present

## 2017-04-07 ENCOUNTER — Other Ambulatory Visit: Payer: Self-pay | Admitting: Orthopedic Surgery

## 2017-04-07 MED ORDER — TRANEXAMIC ACID 1000 MG/10ML IV SOLN
1000.0000 mg | INTRAVENOUS | Status: AC
  Administered 2017-04-08: 1000 mg via INTRAVENOUS
  Filled 2017-04-07: qty 10

## 2017-04-07 MED ORDER — CEFAZOLIN SODIUM-DEXTROSE 2-4 GM/100ML-% IV SOLN: 2.0000 g | INTRAVENOUS | Status: DC

## 2017-04-07 MED ORDER — LACTATED RINGERS IV SOLN: INTRAVENOUS | Status: DC

## 2017-04-08 ENCOUNTER — Encounter (HOSPITAL_COMMUNITY): Payer: Self-pay | Admitting: *Deleted

## 2017-04-08 ENCOUNTER — Inpatient Hospital Stay (HOSPITAL_COMMUNITY)
Admission: RE | Admit: 2017-04-08 | Discharge: 2017-04-09 | DRG: 470 | Disposition: A | Discharge status: Home Health | Payer: Medicare Other | Source: Ambulatory Visit | Attending: Orthopedic Surgery | Admitting: Orthopedic Surgery

## 2017-04-08 ENCOUNTER — Inpatient Hospital Stay (HOSPITAL_COMMUNITY): Payer: Medicare Other | Admitting: Certified Registered Nurse Anesthetist

## 2017-04-08 ENCOUNTER — Inpatient Hospital Stay (HOSPITAL_COMMUNITY): Payer: Medicare Other

## 2017-04-08 ENCOUNTER — Encounter (HOSPITAL_COMMUNITY)
Admission: RE | Disposition: A | Discharge status: Home Health | Payer: Self-pay | Source: Ambulatory Visit | Attending: Orthopedic Surgery

## 2017-04-08 DIAGNOSIS — K219 Gastro-esophageal reflux disease without esophagitis: Secondary | ICD-10-CM | POA: Diagnosis present

## 2017-04-08 DIAGNOSIS — Z96651 Presence of right artificial knee joint: Secondary | ICD-10-CM | POA: Diagnosis not present

## 2017-04-08 DIAGNOSIS — M1711 Unilateral primary osteoarthritis, right knee: Principal | ICD-10-CM

## 2017-04-08 DIAGNOSIS — I10 Essential (primary) hypertension: Secondary | ICD-10-CM | POA: Diagnosis present

## 2017-04-08 DIAGNOSIS — Z471 Aftercare following joint replacement surgery: Secondary | ICD-10-CM | POA: Diagnosis not present

## 2017-04-08 DIAGNOSIS — Z96659 Presence of unspecified artificial knee joint: Secondary | ICD-10-CM

## 2017-04-08 DIAGNOSIS — M25561 Pain in right knee: Secondary | ICD-10-CM | POA: Diagnosis not present

## 2017-04-08 DIAGNOSIS — G8918 Other acute postprocedural pain: Secondary | ICD-10-CM | POA: Diagnosis not present

## 2017-04-08 HISTORY — PX: TOTAL KNEE ARTHROPLASTY: SHX125

## 2017-04-08 SURGERY — ARTHROPLASTY, KNEE, TOTAL
Anesthesia: General | Site: Knee | Laterality: Right

## 2017-04-08 MED ORDER — ASPIRIN EC 325 MG PO TBEC
325.0000 mg | DELAYED_RELEASE_TABLET | Freq: Every day | ORAL | Status: DC
  Administered 2017-04-09: 325 mg via ORAL
  Filled 2017-04-08: qty 1

## 2017-04-08 MED ORDER — POTASSIUM CHLORIDE IN NACL 20-0.9 MEQ/L-% IV SOLN
INTRAVENOUS | Status: DC
  Administered 2017-04-08: 21:00:00 via INTRAVENOUS
  Filled 2017-04-08: qty 1000

## 2017-04-08 MED ORDER — DEXAMETHASONE SODIUM PHOSPHATE 10 MG/ML IJ SOLN
10.0000 mg | Freq: Once | INTRAMUSCULAR | Status: AC
  Administered 2017-04-09: 10 mg via INTRAVENOUS
  Filled 2017-04-08: qty 1

## 2017-04-08 MED ORDER — 0.9 % SODIUM CHLORIDE (POUR BTL) OPTIME
TOPICAL | Status: DC | PRN
  Administered 2017-04-08: 1000 mL

## 2017-04-08 MED ORDER — MENTHOL 3 MG MT LOZG: 1.0000 | LOZENGE | OROMUCOSAL | Status: DC | PRN

## 2017-04-08 MED ORDER — HYDROMORPHONE HCL 1 MG/ML IJ SOLN
INTRAMUSCULAR | Status: AC
  Filled 2017-04-08: qty 0.5

## 2017-04-08 MED ORDER — HYDROCHLOROTHIAZIDE 10 MG/ML ORAL SUSPENSION
6.2500 mg | Freq: Every day | ORAL | Status: DC
  Filled 2017-04-08: qty 1.25

## 2017-04-08 MED ORDER — HYDROMORPHONE HCL 1 MG/ML IJ SOLN
0.2500 mg | INTRAMUSCULAR | Status: DC | PRN
  Administered 2017-04-08 (×4): 0.5 mg via INTRAVENOUS

## 2017-04-08 MED ORDER — POLYETHYLENE GLYCOL 3350 17 G PO PACK
17.0000 g | PACK | Freq: Every day | ORAL | Status: DC | PRN
Start: 2017-04-08 — End: 2017-04-09

## 2017-04-08 MED ORDER — CEFAZOLIN SODIUM-DEXTROSE 2-3 GM-% IV SOLR
INTRAVENOUS | Status: DC | PRN
  Administered 2017-04-08: 2 g via INTRAVENOUS

## 2017-04-08 MED ORDER — MIDAZOLAM HCL 2 MG/2ML IJ SOLN
2.0000 mg | Freq: Once | INTRAMUSCULAR | Status: AC
  Administered 2017-04-08: 2 mg via INTRAVENOUS

## 2017-04-08 MED ORDER — BUPIVACAINE HCL 0.5 % IJ SOLN
INTRAMUSCULAR | Status: DC | PRN
  Administered 2017-04-08: 10 mL

## 2017-04-08 MED ORDER — MIDAZOLAM HCL 2 MG/2ML IJ SOLN: 0.5000 mg | Freq: Once | INTRAMUSCULAR | Status: DC | PRN

## 2017-04-08 MED ORDER — FENTANYL CITRATE (PF) 100 MCG/2ML IJ SOLN
INTRAMUSCULAR | Status: DC
Start: 2017-04-08 — End: 2017-04-08
  Filled 2017-04-08: qty 2

## 2017-04-08 MED ORDER — MAGNESIUM CITRATE PO SOLN: 1.0000 | Freq: Once | ORAL | Status: DC | PRN

## 2017-04-08 MED ORDER — MEPERIDINE HCL 25 MG/ML IJ SOLN: 6.2500 mg | INTRAMUSCULAR | Status: DC | PRN

## 2017-04-08 MED ORDER — METOCLOPRAMIDE HCL 5 MG PO TABS: 5.0000 mg | ORAL_TABLET | Freq: Three times a day (TID) | ORAL | Status: DC | PRN

## 2017-04-08 MED ORDER — MIDAZOLAM HCL 2 MG/2ML IJ SOLN
INTRAMUSCULAR | Status: AC
Start: 2017-04-08 — End: 2017-04-08
  Filled 2017-04-08: qty 2

## 2017-04-08 MED ORDER — CHLORHEXIDINE GLUCONATE 4 % EX LIQD: 60.0000 mL | Freq: Once | CUTANEOUS | Status: DC

## 2017-04-08 MED ORDER — OXYCODONE-ACETAMINOPHEN 5-325 MG PO TABS: 1.0000 | ORAL_TABLET | ORAL | 0 refills | Status: DC | PRN

## 2017-04-08 MED ORDER — METHOCARBAMOL 500 MG PO TABS: 500.0000 mg | ORAL_TABLET | Freq: Four times a day (QID) | ORAL | 0 refills | Status: DC

## 2017-04-08 MED ORDER — OXYCODONE HCL 5 MG PO TABS
ORAL_TABLET | ORAL | Status: AC
  Filled 2017-04-08: qty 2

## 2017-04-08 MED ORDER — ASPIRIN EC 325 MG PO TBEC: 325.0000 mg | DELAYED_RELEASE_TABLET | Freq: Every day | ORAL | 0 refills | Status: DC

## 2017-04-08 MED ORDER — BUPIVACAINE HCL (PF) 0.5 % IJ SOLN
INTRAMUSCULAR | Status: AC
  Filled 2017-04-08: qty 30

## 2017-04-08 MED ORDER — METHOCARBAMOL 500 MG PO TABS
500.0000 mg | ORAL_TABLET | Freq: Four times a day (QID) | ORAL | Status: DC | PRN
  Administered 2017-04-08 – 2017-04-09 (×3): 500 mg via ORAL
  Filled 2017-04-08 (×3): qty 1

## 2017-04-08 MED ORDER — ONDANSETRON HCL 4 MG PO TABS: 4.0000 mg | ORAL_TABLET | Freq: Four times a day (QID) | ORAL | Status: DC | PRN

## 2017-04-08 MED ORDER — ALISKIREN-HYDROCHLOROTHIAZIDE 300-12.5 MG PO TABS: 0.5000 | ORAL_TABLET | Freq: Every day | ORAL | Status: DC

## 2017-04-08 MED ORDER — ONDANSETRON HCL 4 MG/2ML IJ SOLN
INTRAMUSCULAR | Status: DC | PRN
  Administered 2017-04-08: 4 mg via INTRAVENOUS

## 2017-04-08 MED ORDER — PHENOL 1.4 % MT LIQD
1.0000 | OROMUCOSAL | Status: DC | PRN
Start: 2017-04-08 — End: 2017-04-09

## 2017-04-08 MED ORDER — HYDROMORPHONE HCL 1 MG/ML IJ SOLN: 0.5000 mg | INTRAMUSCULAR | Status: DC | PRN

## 2017-04-08 MED ORDER — ALUM & MAG HYDROXIDE-SIMETH 200-200-20 MG/5ML PO SUSP: 30.0000 mL | ORAL | Status: DC | PRN

## 2017-04-08 MED ORDER — MONTELUKAST SODIUM 10 MG PO TABS
10.0000 mg | ORAL_TABLET | Freq: Every day | ORAL | Status: DC
  Administered 2017-04-09: 10 mg via ORAL
  Filled 2017-04-08: qty 1

## 2017-04-08 MED ORDER — BISACODYL 5 MG PO TBEC: 5.0000 mg | DELAYED_RELEASE_TABLET | Freq: Every day | ORAL | Status: DC | PRN

## 2017-04-08 MED ORDER — METHOCARBAMOL 1000 MG/10ML IJ SOLN
500.0000 mg | Freq: Four times a day (QID) | INTRAVENOUS | Status: DC | PRN
  Filled 2017-04-08: qty 5

## 2017-04-08 MED ORDER — ACETAMINOPHEN 325 MG PO TABS
650.0000 mg | ORAL_TABLET | Freq: Four times a day (QID) | ORAL | Status: DC | PRN
  Administered 2017-04-09: 650 mg via ORAL
  Filled 2017-04-08 (×2): qty 2

## 2017-04-08 MED ORDER — FENTANYL CITRATE (PF) 100 MCG/2ML IJ SOLN
INTRAMUSCULAR | Status: DC | PRN
  Administered 2017-04-08: 25 ug via INTRAVENOUS
  Administered 2017-04-08: 50 ug via INTRAVENOUS
  Administered 2017-04-08 (×5): 25 ug via INTRAVENOUS
  Administered 2017-04-08: 50 ug via INTRAVENOUS

## 2017-04-08 MED ORDER — BUPIVACAINE LIPOSOME 1.3 % IJ SUSP
20.0000 mL | INTRAMUSCULAR | Status: AC
  Administered 2017-04-08: 20 mL
  Filled 2017-04-08: qty 20

## 2017-04-08 MED ORDER — MIDAZOLAM HCL 2 MG/2ML IJ SOLN
INTRAMUSCULAR | Status: AC
  Filled 2017-04-08: qty 2

## 2017-04-08 MED ORDER — METOCLOPRAMIDE HCL 5 MG/ML IJ SOLN: 5.0000 mg | Freq: Three times a day (TID) | INTRAMUSCULAR | Status: DC | PRN

## 2017-04-08 MED ORDER — ALISKIREN FUMARATE 150 MG PO TABS
150.0000 mg | ORAL_TABLET | Freq: Every day | ORAL | Status: DC
  Administered 2017-04-09: 150 mg via ORAL
  Filled 2017-04-08: qty 1

## 2017-04-08 MED ORDER — ALISKIREN FUMARATE 150 MG PO TABS: 300.0000 mg | ORAL_TABLET | Freq: Every day | ORAL | Status: DC

## 2017-04-08 MED ORDER — EST ESTROGENS-METHYLTEST 1.25-2.5 MG PO TABS: 1.0000 | ORAL_TABLET | Freq: Every day | ORAL | Status: DC

## 2017-04-08 MED ORDER — OXYCODONE HCL 5 MG PO TABS
5.0000 mg | ORAL_TABLET | ORAL | Status: DC | PRN
  Administered 2017-04-08 (×2): 10 mg via ORAL
  Administered 2017-04-08: 5 mg via ORAL
  Administered 2017-04-09 (×3): 10 mg via ORAL
  Filled 2017-04-08 (×5): qty 2

## 2017-04-08 MED ORDER — ROPIVACAINE HCL 5 MG/ML IJ SOLN
INTRAMUSCULAR | Status: DC | PRN
  Administered 2017-04-08: 30 mg via PERINEURAL

## 2017-04-08 MED ORDER — METHOCARBAMOL 500 MG PO TABS
ORAL_TABLET | ORAL | Status: AC
  Filled 2017-04-08: qty 1

## 2017-04-08 MED ORDER — LACTATED RINGERS IV SOLN
INTRAVENOUS | Status: DC
  Administered 2017-04-08 (×2): via INTRAVENOUS

## 2017-04-08 MED ORDER — SODIUM CHLORIDE 0.9 % IJ SOLN
INTRAMUSCULAR | Status: DC | PRN
  Administered 2017-04-08: 10 mL

## 2017-04-08 MED ORDER — PROPOFOL 10 MG/ML IV BOLUS
INTRAVENOUS | Status: DC | PRN
  Administered 2017-04-08: 150 mg via INTRAVENOUS

## 2017-04-08 MED ORDER — ONDANSETRON HCL 4 MG/2ML IJ SOLN: 4.0000 mg | Freq: Four times a day (QID) | INTRAMUSCULAR | Status: DC | PRN

## 2017-04-08 MED ORDER — LIDOCAINE HCL (CARDIAC) 20 MG/ML IV SOLN
INTRAVENOUS | Status: DC | PRN
  Administered 2017-04-08: 20 mg via INTRAVENOUS

## 2017-04-08 MED ORDER — FENTANYL CITRATE (PF) 250 MCG/5ML IJ SOLN
INTRAMUSCULAR | Status: AC
  Filled 2017-04-08: qty 5

## 2017-04-08 MED ORDER — ACETAMINOPHEN 650 MG RE SUPP
650.0000 mg | Freq: Four times a day (QID) | RECTAL | Status: DC | PRN
  Filled 2017-04-08: qty 1

## 2017-04-08 MED ORDER — CEFAZOLIN SODIUM-DEXTROSE 2-4 GM/100ML-% IV SOLN
2.0000 g | Freq: Four times a day (QID) | INTRAVENOUS | Status: AC
  Administered 2017-04-08 (×2): 2 g via INTRAVENOUS
  Filled 2017-04-08 (×2): qty 100

## 2017-04-08 MED ORDER — DOCUSATE SODIUM 100 MG PO CAPS
100.0000 mg | ORAL_CAPSULE | Freq: Two times a day (BID) | ORAL | Status: DC
  Administered 2017-04-08 – 2017-04-09 (×2): 100 mg via ORAL
  Filled 2017-04-08 (×2): qty 1

## 2017-04-08 MED ORDER — SODIUM CHLORIDE 0.9 % IR SOLN
Status: DC | PRN
  Administered 2017-04-08: 3000 mL

## 2017-04-08 MED ORDER — ONDANSETRON HCL 4 MG PO TABS: 4.0000 mg | ORAL_TABLET | Freq: Three times a day (TID) | ORAL | 0 refills | Status: DC | PRN

## 2017-04-08 MED ORDER — DIPHENHYDRAMINE HCL 12.5 MG/5ML PO ELIX: 12.5000 mg | ORAL_SOLUTION | ORAL | Status: DC | PRN

## 2017-04-08 MED ORDER — PROMETHAZINE HCL 25 MG/ML IJ SOLN: 6.2500 mg | INTRAMUSCULAR | Status: DC | PRN

## 2017-04-08 MED ORDER — MIDAZOLAM HCL 5 MG/5ML IJ SOLN
INTRAMUSCULAR | Status: DC | PRN
  Administered 2017-04-08: 2 mg via INTRAVENOUS

## 2017-04-08 MED ORDER — ZOLPIDEM TARTRATE 5 MG PO TABS: 5.0000 mg | ORAL_TABLET | Freq: Every evening | ORAL | Status: DC | PRN

## 2017-04-08 SURGICAL SUPPLY — 70 items
APL SKNCLS STERI-STRIP NONHPOA (GAUZE/BANDAGES/DRESSINGS) ×1
BANDAGE ACE 4X5 VEL STRL LF (GAUZE/BANDAGES/DRESSINGS) ×3 IMPLANT
BANDAGE ACE 6X5 VEL STRL LF (GAUZE/BANDAGES/DRESSINGS) ×3 IMPLANT
BANDAGE ESMARK 6X9 LF (GAUZE/BANDAGES/DRESSINGS) ×1 IMPLANT
BENZOIN TINCTURE PRP APPL 2/3 (GAUZE/BANDAGES/DRESSINGS) ×3 IMPLANT
BLADE SAG 18X100X1.27 (BLADE) ×6 IMPLANT
BLADE SAGITTAL 13X1.27X60 (BLADE) ×2 IMPLANT
BLADE SAGITTAL 13X1.27X60MM (BLADE) ×1
BLADE SURG 10 STRL SS (BLADE) ×3 IMPLANT
BNDG ESMARK 6X9 LF (GAUZE/BANDAGES/DRESSINGS) ×3
BOWL SMART MIX CTS (DISPOSABLE) ×3 IMPLANT
CAPT KNEE TOTAL 2 ×3 IMPLANT
CEMENT BONE SIMPLEX SPEEDSET (Cement) ×6 IMPLANT
CLOSURE STERI-STRIP 1/2X4 (GAUZE/BANDAGES/DRESSINGS) ×1
CLOSURE WOUND 1/2 X4 (GAUZE/BANDAGES/DRESSINGS) ×2
CLSR STERI-STRIP ANTIMIC 1/2X4 (GAUZE/BANDAGES/DRESSINGS) ×2 IMPLANT
COVER SURGICAL LIGHT HANDLE (MISCELLANEOUS) ×3 IMPLANT
CUFF TOURNIQUET SINGLE 34IN LL (TOURNIQUET CUFF) ×3 IMPLANT
DECANTER SPIKE VIAL GLASS SM (MISCELLANEOUS) ×3 IMPLANT
DRAPE EXTREMITY T 121X128X90 (DRAPE) ×3 IMPLANT
DRAPE HALF SHEET 40X57 (DRAPES) ×6 IMPLANT
DRAPE IMP U-DRAPE 54X76 (DRAPES) ×3 IMPLANT
DRAPE U-SHAPE 47X51 STRL (DRAPES) ×3 IMPLANT
DRSG AQUACEL AG ADV 3.5X10 (GAUZE/BANDAGES/DRESSINGS) ×3 IMPLANT
DRSG PAD ABDOMINAL 8X10 ST (GAUZE/BANDAGES/DRESSINGS) ×3 IMPLANT
DURAPREP 26ML APPLICATOR (WOUND CARE) ×6 IMPLANT
ELECT CAUTERY BLADE 6.4 (BLADE) ×3 IMPLANT
ELECT REM PT RETURN 9FT ADLT (ELECTROSURGICAL) ×3
ELECTRODE REM PT RTRN 9FT ADLT (ELECTROSURGICAL) ×1 IMPLANT
EVACUATOR 1/8 PVC DRAIN (DRAIN) IMPLANT
FACESHIELD WRAPAROUND (MASK) ×6 IMPLANT
GLOVE BIOGEL PI IND STRL 7.0 (GLOVE) ×3 IMPLANT
GLOVE BIOGEL PI INDICATOR 7.0 (GLOVE) ×6
GLOVE ORTHO TXT STRL SZ7.5 (GLOVE) ×12 IMPLANT
GLOVE SURG ORTHO 7.0 STRL STRW (GLOVE) ×6 IMPLANT
GOWN STRL REUS W/ TWL LRG LVL3 (GOWN DISPOSABLE) ×2 IMPLANT
GOWN STRL REUS W/ TWL XL LVL3 (GOWN DISPOSABLE) ×4 IMPLANT
GOWN STRL REUS W/TWL LRG LVL3 (GOWN DISPOSABLE) ×4
GOWN STRL REUS W/TWL XL LVL3 (GOWN DISPOSABLE) ×8
HANDPIECE INTERPULSE COAX TIP (DISPOSABLE) ×2
IMMOBILIZER KNEE 22 UNIV (SOFTGOODS) ×3 IMPLANT
IMMOBILIZER KNEE 24 THIGH 36 (MISCELLANEOUS) IMPLANT
IMMOBILIZER KNEE 24 UNIV (MISCELLANEOUS)
KIT BASIN OR (CUSTOM PROCEDURE TRAY) ×3 IMPLANT
KIT ROOM TURNOVER OR (KITS) ×3 IMPLANT
KNEE CAPITATED TOTAL 2 ×1 IMPLANT
MANIFOLD NEPTUNE II (INSTRUMENTS) ×3 IMPLANT
NEEDLE 18GX1X1/2 (RX/OR ONLY) (NEEDLE) ×3 IMPLANT
NEEDLE HYPO 25GX1X1/2 BEV (NEEDLE) ×3 IMPLANT
NS IRRIG 1000ML POUR BTL (IV SOLUTION) ×3 IMPLANT
PACK TOTAL JOINT (CUSTOM PROCEDURE TRAY) ×3 IMPLANT
PACK UNIVERSAL I (CUSTOM PROCEDURE TRAY) IMPLANT
PAD ARMBOARD 7.5X6 YLW CONV (MISCELLANEOUS) ×6 IMPLANT
SET HNDPC FAN SPRY TIP SCT (DISPOSABLE) ×1 IMPLANT
STRIP CLOSURE SKIN 1/2X4 (GAUZE/BANDAGES/DRESSINGS) ×4 IMPLANT
SUCTION FRAZIER HANDLE 10FR (MISCELLANEOUS) ×2
SUCTION TUBE FRAZIER 10FR DISP (MISCELLANEOUS) ×1 IMPLANT
SUT MNCRL AB 4-0 PS2 18 (SUTURE) ×3 IMPLANT
SUT VIC AB 0 CT1 27 (SUTURE)
SUT VIC AB 0 CT1 27XBRD ANBCTR (SUTURE) IMPLANT
SUT VIC AB 1 CTX 36 (SUTURE) ×2
SUT VIC AB 1 CTX36XBRD ANBCTR (SUTURE) ×1 IMPLANT
SUT VIC AB 2-0 CT1 27 (SUTURE) ×4
SUT VIC AB 2-0 CT1 TAPERPNT 27 (SUTURE) ×2 IMPLANT
SYR 50ML LL SCALE MARK (SYRINGE) ×3 IMPLANT
SYR CONTROL 10ML LL (SYRINGE) ×3 IMPLANT
TOWEL OR 17X24 6PK STRL BLUE (TOWEL DISPOSABLE) ×3 IMPLANT
TOWEL OR 17X26 10 PK STRL BLUE (TOWEL DISPOSABLE) ×3 IMPLANT
TRAY CATH 16FR W/PLASTIC CATH (SET/KITS/TRAYS/PACK) IMPLANT
WATER STERILE IRR 1000ML POUR (IV SOLUTION) IMPLANT

## 2017-04-08 NOTE — Discharge Summary (Addendum)
Patient ID: Taylor Kent MRN: [id number] DOB/AGE: 1941/03/07 76 y.o.  Admit date: 04/08/2017 Discharge date: 04/09/2017  Admission Diagnoses:  Principal Problem:   Primary localized osteoarthritis of right knee   Discharge Diagnoses:  Same  Past Medical History:  Diagnosis Date  . Arthritis   . Cancer ([place])    basal cell - nose  . GERD (gastroesophageal reflux disease)   . Guillain Barr syndrome ([place]) 2015   after flu shot  . Hypertension   . Shingles 2015    Surgeries: Procedure(s): RIGHT TOTAL KNEE ARTHROPLASTY on 04/08/2017   Consultants:   Discharged Condition: Improved  Hospital Course: EVANGELA HEFFLER is an 76 y.o. female who was admitted 04/08/2017 for operative treatment ofPrimary localized osteoarthritis of right knee. Patient has severe unremitting pain that affects sleep, daily activities, and work/hobbies. After pre-op clearance the patient was taken to the operating room on 04/08/2017 and underwent  Procedure(s): RIGHT TOTAL KNEE ARTHROPLASTY.    Patient was given perioperative antibiotics:  Anti-infectives    Start     Dose/Rate Route Frequency Ordered Stop   04/08/17 1800  ceFAZolin (ANCEF) IVPB 2g/100 mL premix     2 g 200 mL/hr over 30 Minutes Intravenous Every 6 hours 04/08/17 1350 04/09/17 0029   04/08/17 1100  ceFAZolin (ANCEF) IVPB 2g/100 mL premix  Status:  Discontinued     2 g 200 mL/hr over 30 Minutes Intravenous To ShortStay Surgical 04/07/17 1159 04/08/17 1350       Patient was given sequential compression devices, early ambulation, and chemoprophylaxis to prevent DVT.  Patient benefited maximally from hospital stay and there were no complications.    Recent vital signs:  Patient Vitals for the past 24 hrs:  BP Temp Temp src Pulse Resp SpO2 Height Weight  04/09/17 0445 (!) 151/62 98.6 F (37 C) Oral 90 15 98 % - -  04/09/17 0056 (!) 150/58 98.6 F (37 C) Oral 88 17 99 % - -  04/08/17 2100 (!) 155/66 98.5 F (36.9 C) Oral 88 - 98 %  - -  04/08/17 1655 (!) 155/88 98.1 F (36.7 C) - 76 - 99 % - -  04/08/17 1630 - - - - 16 - - -  04/08/17 1610 (!) 148/66 98.1 F (36.7 C) - 78 19 98 % - -  04/08/17 1540 137/75 - - 76 12 100 % - -  04/08/17 1525 (!) 147/64 - - 68 13 98 % - -  04/08/17 1510 (!) 145/66 - - 77 18 95 % - -  04/08/17 1455 139/72 - - 69 10 100 % - -  04/08/17 1440 (!) 160/80 - - 73 14 100 % - -  04/08/17 1425 110/76 - - 70 10 100 % - -  04/08/17 1410 (!) 153/73 - - 71 17 100 % - -  04/08/17 1355 (!) 166/66 97.8 F (36.6 C) - 75 15 100 % - -  04/08/17 1120 (!) 183/65 - - 85 (!) 21 100 % - -  04/08/17 1115 - - - 86 19 100 % - -  04/08/17 1110 - - - 89 (!) 24 100 % - -  04/08/17 0900 (!) 172/72 97.8 F (36.6 C) Oral 81 20 100 % - -  04/08/17 0859 - - - - - - 5\' 6"  (1.676 m) 81.6 kg (180 lb)     Recent laboratory studies:   Recent Labs  04/09/17 0308  WBC 10.7*  HGB 11.1*  HCT 35.1*  PLT 243  NA 135  K 4.0  CL 104  CO2 25  BUN 13  CREATININE 0.97  GLUCOSE 159*  CALCIUM 8.1*     Discharge Medications:   Allergies as of 04/09/2017      Reactions   Fluzone [flu Virus Vaccine] Other (See Comments)   GUILLIAN BARRE   Prevacid [lansoprazole] Shortness Of Breath   Dizzy   Shellfish Allergy Other (See Comments)   Increased heart rate flushing   Sulfa Antibiotics Hives   Silver Rash   Rash      Medication List    STOP taking these medications   acetaminophen 500 MG tablet Commonly known as:  TYLENOL   atovaquone-proguanil 250-100 MG Tabs tablet Commonly known as:  MALARONE   azithromycin 500 MG tablet Commonly known as:  ZITHROMAX     TAKE these medications   aspirin EC 325 MG tablet Take 1 tablet (325 mg total) by mouth daily. 1 tab a day for the next 30 days to prevent blood clots   esomeprazole 20 MG capsule Commonly known as:  NEXIUM Take 20 mg by mouth daily at 12 noon.   estrogens-methylTEST 1.25-2.5 MG tablet Commonly known as:  ESTRATEST Take 1 tablet by mouth  daily.   methocarbamol 500 MG tablet Commonly known as:  ROBAXIN Take 1 tablet (500 mg total) by mouth 4 (four) times daily. What changed:  medication strength  how much to take  when to take this  reasons to take this  additional instructions   montelukast 10 MG tablet Commonly known as:  SINGULAIR Take 10 mg by mouth daily.   ondansetron 4 MG tablet Commonly known as:  ZOFRAN Take 1 tablet (4 mg total) by mouth every 8 (eight) hours as needed for nausea or vomiting.   oxyCODONE-acetaminophen 5-325 MG tablet Commonly known as:  PERCOCET Take 1-2 tablets by mouth every 4 (four) hours as needed for severe pain.   TEKTURNA HCT 300-12.5 MG Tabs Generic drug:  Aliskiren-Hydrochlorothiazide Take 0.5 tablets by mouth daily. Patient takes 1/2 of 300-125 mg       Diagnostic Studies: Dg Chest 2 View  Result Date: 03/30/2017 CLINICAL DATA:  Preoperative examination prior to the arthroplasty. No current chest complaints. History of hypertension. Nonsmoker. EXAM: CHEST  2 VIEW COMPARISON:  Chest x-ray of July 23, 2015 FINDINGS: The lungs are adequately inflated. The interstitial markings are coarse though stable. The heart and pulmonary vascularity are normal. The mediastinum is normal in width. The trachea is midline. The bony thorax exhibits no acute abnormality. IMPRESSION: There is no pneumonia, CHF, nor other acute cardiopulmonary abnormality. Electronically Signed   By: David  Martinique M.D.   On: 03/30/2017 12:53   Dg Knee Right Port  Result Date: 04/08/2017 CLINICAL DATA:  Knee arthroplasty EXAM: PORTABLE RIGHT KNEE - 1-2 VIEW COMPARISON:  MRI 07/23/2015. FINDINGS: RIGHT total knee arthroplasty. No fracture dislocation. Loose body posterior the joint not changed from comparison MRI. IMPRESSION: No complication following knee arthroplasty. Electronically Signed   By: Suzy Bouchard M.D.   On: 04/08/2017 18:40    Disposition: Final discharge disposition not  confirmed    Follow-up Information    [name], MD. Schedule an appointment as soon as possible for a visit in 2 weeks.   Specialty:  Orthopedic Surgery Contact information: [place] [address] [phone]            Signed: Fannie Knee 04/09/2017, 7:41 AM

## 2017-04-08 NOTE — Discharge Instructions (Signed)
INSTRUCTIONS AFTER JOINT REPLACEMENT   o Remove items at home which could result in a fall. This includes throw rugs or furniture in walking pathways o ICE to the affected joint every three hours while awake for 30 minutes at a time, for at least the first 3-5 days, and then as needed for pain and swelling.  Continue to use ice for pain and swelling. You may notice swelling that will progress down to the foot and ankle.  This is normal after surgery.  Elevate your leg when you are not up walking on it.   o Continue to use the breathing machine you got in the hospital (incentive spirometer) which will help keep your temperature down.  It is common for your temperature to cycle up and down following surgery, especially at night when you are not up moving around and exerting yourself.  The breathing machine keeps your lungs expanded and your temperature down.  DVT PROPHYLAXIS: TAKE ASPIRIN 325 MG ONE TAB ONCE DAILY X 30 DAYS FOLLOWING SURGERY TO PREVENT BLOOD CLOTS  DIET:  As you were doing prior to hospitalization, we recommend a well-balanced diet.  DRESSING / WOUND CARE / SHOWERING  Keep the surgical dressing until follow up.  The dressing is water proof, so you can shower without any extra covering.  IF THE DRESSING FALLS OFF or the wound gets wet inside, change the dressing with sterile gauze.  Please use good hand washing techniques before changing the dressing.  Do not use any lotions or creams on the incision until instructed by your surgeon.    ACTIVITY  o Increase activity slowly as tolerated, but follow the weight bearing instructions below.   o No driving for 6 weeks or until further direction given by your physician.  You cannot drive while taking narcotics.  o No lifting or carrying greater than 10 lbs. until further directed by your surgeon. o Avoid periods of inactivity such as sitting longer than an hour when not asleep. This helps prevent blood clots.  o You may return to work once  you are authorized by your doctor.     WEIGHT BEARING   Weight bearing as tolerated with assist device (walker, cane, etc) as directed, use it as long as suggested by your surgeon or therapist, typically at least 4-6 weeks.   EXERCISES  Results after joint replacement surgery are often greatly improved when you follow the exercise, range of motion and muscle strengthening exercises prescribed by your doctor. Safety measures are also important to protect the joint from further injury. Any time any of these exercises cause you to have increased pain or swelling, decrease what you are doing until you are comfortable again and then slowly increase them. If you have problems or questions, call your caregiver or physical therapist for advice.   Rehabilitation is important following a joint replacement. After just a few days of immobilization, the muscles of the leg can become weakened and shrink (atrophy).  These exercises are designed to build up the tone and strength of the thigh and leg muscles and to improve motion. Often times heat used for twenty to thirty minutes before working out will loosen up your tissues and help with improving the range of motion but do not use heat for the first two weeks following surgery (sometimes heat can increase post-operative swelling).   These exercises can be done on a training (exercise) mat, on the floor, on a table or on a bed. Use whatever works the best  and is most comfortable for you.    Use music or television while you are exercising so that the exercises are a pleasant break in your day. This will make your life better with the exercises acting as a break in your routine that you can look forward to.   Perform all exercises about fifteen times, three times per day or as directed.  You should exercise both the operative leg and the other leg as well.  Exercises include:    Quad Sets - Tighten up the muscle on the front of the thigh (Quad) and hold for  5-10 seconds.    Straight Leg Raises - With your knee straight (if you were given a brace, keep it on), lift the leg to 60 degrees, hold for 3 seconds, and slowly lower the leg.  Perform this exercise against resistance later as your leg gets stronger.   Leg Slides: Lying on your back, slowly slide your foot toward your buttocks, bending your knee up off the floor (only go as far as is comfortable). Then slowly slide your foot back down until your leg is flat on the floor again.   Angel Wings: Lying on your back spread your legs to the side as far apart as you can without causing discomfort.   Hamstring Strength:  Lying on your back, push your heel against the floor with your leg straight by tightening up the muscles of your buttocks.  Repeat, but this time bend your knee to a comfortable angle, and push your heel against the floor.  You may put a pillow under the heel to make it more comfortable if necessary.   A rehabilitation program following joint replacement surgery can speed recovery and prevent re-injury in the future due to weakened muscles. Contact your doctor or a physical therapist for more information on knee rehabilitation.    CONSTIPATION  Constipation is defined medically as fewer than three stools per week and severe constipation as less than one stool per week.  Even if you have a regular bowel pattern at home, your normal regimen is likely to be disrupted due to multiple reasons following surgery.  Combination of anesthesia, postoperative narcotics, change in appetite and fluid intake all can affect your bowels.   YOU MUST use at least one of the following options; they are listed in order of increasing strength to get the job done.  They are all available over the counter, and you may need to use some, POSSIBLY even all of these options:    Drink plenty of fluids (prune juice may be helpful) and high fiber foods Colace 100 mg by mouth twice a day  Senokot for constipation as  directed and as needed Dulcolax (bisacodyl), take with full glass of water  Miralax (polyethylene glycol) once or twice a day as needed.  If you have tried all these things and are unable to have a bowel movement in the first 3-4 days after surgery call either your surgeon or your primary doctor.    If you experience loose stools or diarrhea, hold the medications until you stool forms back up.  If your symptoms do not get better within 1 week or if they get worse, check with your doctor.  If you experience "the worst abdominal pain ever" or develop nausea or vomiting, please contact the office immediately for further recommendations for treatment.   ITCHING:  If you experience itching with your medications, try taking only a single pain pill, or even half a  pain pill at a time.  You can also use Benadryl over the counter for itching or also to help with sleep.   TED HOSE STOCKINGS:  Use stockings on both legs until for at least 2 weeks or as directed by physician office. They may be removed at night for sleeping.  MEDICATIONS:  See your medication summary on the After Visit Summary that nursing will review with you.  You may have some home medications which will be placed on hold until you complete the course of blood thinner medication.  It is important for you to complete the blood thinner medication as prescribed.  PRECAUTIONS:  If you experience chest pain or shortness of breath - call 911 immediately for transfer to the hospital emergency department.   If you develop a fever greater that 101 F, purulent drainage from wound, increased redness or drainage from wound, foul odor from the wound/dressing, or calf pain - CONTACT YOUR SURGEON.                                                   FOLLOW-UP APPOINTMENTS:  If you do not already have a post-op appointment, please call the office for an appointment to be seen by your surgeon.  Guidelines for how soon to be seen are listed in your After  Visit Summary, but are typically between 1-4 weeks after surgery.  OTHER INSTRUCTIONS:   Knee Replacement:  Do not place pillow under knee, focus on keeping the knee straight while resting. CPM instructions: 0-90 degrees, 2 hours in the morning, 2 hours in the afternoon, and 2 hours in the evening. Place foam block, curve side up under heel at all times except when in CPM or when walking.  DO NOT modify, tear, cut, or change the foam block in any way.  MAKE SURE YOU:   Understand these instructions.   Get help right away if you are not doing well or get worse.    Thank you for letting us be a part of your medical care team.  It is a privilege we respect greatly.  We hope these instructions will help you stay on track for a fast and full recovery!

## 2017-04-08 NOTE — Anesthesia Postprocedure Evaluation (Signed)
Anesthesia Post Note  Patient: Taylor Kent  Procedure(s) Performed: Procedure(s) (LRB): RIGHT TOTAL KNEE ARTHROPLASTY (Right)  Patient location during evaluation: PACU Anesthesia Type: General and Regional Level of consciousness: awake and alert, oriented and patient cooperative Pain management: pain level controlled Vital Signs Assessment: post-procedure vital signs reviewed and stable Respiratory status: spontaneous breathing, nonlabored ventilation, respiratory function stable and patient connected to nasal cannula oxygen Cardiovascular status: blood pressure returned to baseline and stable Postop Assessment: no signs of nausea or vomiting Anesthetic complications: no       Last Vitals:  Vitals:   04/08/17 1440 04/08/17 1455  BP: (!) 160/80 139/72  Pulse: 73 69  Resp: 14 10  Temp:      Last Pain:  Vitals:   04/08/17 1429  TempSrc:   PainSc: 8                  Latavious Bitter,E. Cina Klumpp

## 2017-04-08 NOTE — Progress Notes (Signed)
Orthopedic Tech Progress Note Patient Details:  NYA MONDS 1941/01/09 [id number]  CPM Right Knee CPM Right Knee: On Right Knee Flexion (Degrees): 90 Right Knee Extension (Degrees): 0 Additional Comments: Applied cpm to pt right knee/leg at 0-90 degrees.  Pt tolerated well.  Provided bone foam zero degree footsie roll at bedside.  Right knee/Leg.   Kristopher Oppenheim 04/08/2017, 2:32 PM

## 2017-04-08 NOTE — Anesthesia Procedure Notes (Signed)
Procedure Name: LMA Insertion Performed by: Shean Gerding B Pre-anesthesia Checklist: Patient identified, Emergency Drugs available, Suction available, Patient being monitored and Timeout performed Patient Re-evaluated:Patient Re-evaluated prior to inductionOxygen Delivery Method: Circle system utilized Preoxygenation: Pre-oxygenation with 100% oxygen Intubation Type: IV induction LMA: LMA inserted LMA Size: 4.0 Number of attempts: 1 Placement Confirmation: positive ETCO2 and breath sounds checked- equal and bilateral Tube secured with: Tape Dental Injury: Teeth and Oropharynx as per pre-operative assessment        

## 2017-04-08 NOTE — Anesthesia Procedure Notes (Signed)
Anesthesia Regional Block: Adductor canal block   Pre-Anesthetic Checklist: ,, timeout performed, Correct Patient, Correct Site, Correct Laterality, Correct Procedure, Correct Position, site marked, Risks and benefits discussed,  Surgical consent,  Pre-op evaluation,  At surgeon's request and post-op pain management  Laterality: Right and Lower  Prep: chloraprep       Needles:  Injection technique: Single-shot  Needle Type: Echogenic Needle     Needle Length: 9cm  Needle Gauge: 21     Additional Needles:   Procedures: ultrasound guided,,,,,,,,  Narrative:  Start time: 04/08/2017 11:13 AM End time: 04/08/2017 11:22 AM Injection made incrementally with aspirations every 5 mL.  Performed by: Personally  Anesthesiologist: Glennon Mac, Annelies Coyt  Additional Notes: Pt identified in Holding room.  Monitors applied. Working IV access confirmed. Sterile prep, drape R thigh.  #21ga ECHOgenic needle into adductor canal with US guidance.  30cc 0.5% Ropivacaine injected incrementally after negative test dose, good spread in canal.  Patient asymptomatic, VSS, no heme aspirated, tolerated well.  Jenita Seashore, MD

## 2017-04-08 NOTE — Transfer of Care (Signed)
Immediate Anesthesia Transfer of Care Note  Patient: Taylor Kent  Procedure(s) Performed: Procedure(s): RIGHT TOTAL KNEE ARTHROPLASTY (Right)  Patient Location: PACU  Anesthesia Type:GA combined with regional for post-op pain  Level of Consciousness: sedated  Airway & Oxygen Therapy: Patient Spontanous Breathing and Patient connected to face mask  Post-op Assessment: Report given to RN and Post -op Vital signs reviewed and stable  Post vital signs: Reviewed and stable  Last Vitals:  Vitals:   04/08/17 1120 04/08/17 1355  BP: (!) 183/65   Pulse: 85 75  Resp: (!) 21 15  Temp:  36.6 C    Last Pain:  Vitals:   04/08/17 1134  TempSrc:   PainSc: 2          Complications: No apparent anesthesia complications

## 2017-04-08 NOTE — Anesthesia Preprocedure Evaluation (Signed)
Anesthesia Evaluation  Patient identified by MRN, date of birth, ID band Patient awake    Reviewed: Allergy & Precautions, NPO status , Patient's Chart, lab work & pertinent test results  History of Anesthesia Complications (+) POST - OP SPINAL HEADACHE  Airway Mallampati: II  TM Distance: >3 FB Neck ROM: Full    Dental  (+) Dental Advisory Given   Pulmonary neg pulmonary ROS,    breath sounds clear to auscultation       Cardiovascular hypertension, Pt. on medications (-) angina Rhythm:Regular Rate:Normal     Neuro/Psych negative neurological ROS     GI/Hepatic Neg liver ROS, GERD  Medicated and Controlled,  Endo/Other  negative endocrine ROS  Renal/GU negative Renal ROS     Musculoskeletal  (+) Arthritis , Osteoarthritis,    Abdominal   Peds  Hematology negative hematology ROS (+)   Anesthesia Other Findings   Reproductive/Obstetrics                             Anesthesia Physical Anesthesia Plan  ASA: II  Anesthesia Plan: General   Post-op Pain Management: GA combined w/ Regional for post-op pain   Induction: Intravenous  Airway Management Planned: Oral ETT  Additional Equipment:   Intra-op Plan:   Post-operative Plan: Extubation in OR  Informed Consent: I have reviewed the patients History and Physical, chart, labs and discussed the procedure including the risks, benefits and alternatives for the proposed anesthesia with the patient or authorized representative who has indicated his/her understanding and acceptance.   Dental advisory given  Plan Discussed with: CRNA and Surgeon  Anesthesia Plan Comments: (Plan routine monitors, GA- adductor canal block for post op analgesia Pt refuses SAB, and refuses to receive Blood products)        Anesthesia Quick Evaluation

## 2017-04-08 NOTE — Interval H&P Note (Signed)
History and Physical Interval Note:  04/08/2017 11:32 AM  Taylor Kent  has presented today for surgery, with the diagnosis of DJD RIGHT KNEE  The various methods of treatment have been discussed with the patient and family. After consideration of risks, benefits and other options for treatment, the patient has consented to  Procedure(s): RIGHT TOTAL KNEE ARTHROPLASTY (Right) as a surgical intervention .  The patient's history has been reviewed, patient examined, no change in status, stable for surgery.  I have reviewed the patient's chart and labs.  Questions were answered to the patient's satisfaction.     [name]

## 2017-04-08 NOTE — Progress Notes (Signed)
Patient refusing blood products including plasma. Blood bank made aware.

## 2017-04-08 NOTE — Op Note (Signed)
NAME:  Taylor Kent, Taylor Kent NO.:  [id number]  MEDICAL RECORD NO.:  [id number]  LOCATION:  [facility]                         FACILITY:  [facility]  PHYSICIAN:  [name], M.D. DATE OF BIRTH:  1941/04/07  DATE OF PROCEDURE:  04/08/2017 DATE OF DISCHARGE:                              OPERATIVE REPORT   PREOPERATIVE DIAGNOSES:  Right knee end-stage arthritis, primary localized.  Excessive valgus alignment with bony loss, lateral compartment.  POSTOPERATIVE DIAGNOSES:  Right knee end-stage arthritis, primary localized.  Excessive valgus alignment with bony loss, lateral compartment.  PROCEDURES:  Right knee modified minimally invasive total knee replacement utilizing a Zimmer NextGen titanium prosthesis.  A cemented pegged posterior stabilized size E femoral component.  Cemented #4 tibial component with a 10-mm posterior stabilized polyethylene insert. Cemented resurfacing 32-mm patellar component.  SURGEON:  [name], M.D.  ASSISTANT:  Lanier Clam, PA.  Present throughout the entire case and necessary for timely completion of procedure.  ANESTHESIA:  General.  BLOOD LOSS:  Minimal.  SPECIMENS:  None.  CULTURES:  None.  COMPLICATIONS:  None.  DRESSINGS:  Soft compressive knee immobilizer.  TOURNIQUET TIME:  1 hour and 50 minutes.  DESCRIPTION OF PROCEDURE:  The patient was brought to operating room, and after adequate anesthesia had been obtained, tourniquet applied. Prepped and draped in usual sterile fashion.  Exsanguinated with elevation of Esmarch.  Tourniquet inflated to 350 mmHg.  Excessive valgus 15 degrees, correctable to about 7 degrees.  Underlying nickel allergy which is why we chose this prosthesis.  Straight incision above the patella down to tibial tubercle.  Skin and subcutaneous tissue divided.  Medial arthrotomy, vastus splitting, preserving quad tendon. Knee exposed.  Bone loss primarily on the lateral femur but also on  the tibia.  Intramedullary guide, distal femur.  10 mm resection, 5 degrees of valgus.  Using epicondylar axis and instrumentation from Zimmer, the femur was sized, cut, and fitted for posterior stabilized pegged size E component.  Proximal tibial resection with extramedullary guide taken below the defect laterally.  This was sized to #4 component.  Patella exposed, posterior 9 mm removed.  Drilled, sized, and fitted for a 32-mm component.  After appropriate trials, I utilized the 10-mm insert.  Very pleased with correction of valgus, full motion, good stability, good patellar tracking, and I did not need to add any further releases.  The tibia was prepared and reamed to the tibial component.  One adhered in the proximal tibia was curetted out and packed with cancellous bone. Knee thoroughly irrigated.  Cement prepared, placed on all components, firmly seated.  Polyethylene attached to tibia and knee reduced. Patella held with a clamp.  Once the cement hardened, the knee was irrigated again.  Again pleased with full motion, good alignment, nice biomechanical axis, good stability, and good patellar tracking.  Soft tissue was injected with Exparel.  Arthrotomy closed with #1 Vicryl. Subcutaneous and subcuticular closure.  Sterile compressive dressing applied.  Anesthesia reversed.  Brought to the recovery room.  Tolerated the surgery well and no complications.     [name], M.D.     DFM/MEDQ  D:  04/08/2017  T:  04/08/2017  Job:  [id number] 

## 2017-04-09 ENCOUNTER — Encounter (HOSPITAL_COMMUNITY): Payer: Self-pay | Admitting: Orthopedic Surgery

## 2017-04-09 LAB — CBC
HCT: 35.1 % — ABNORMAL LOW (ref 36.0–46.0)
Hemoglobin: 11.1 g/dL — ABNORMAL LOW (ref 12.0–15.0)
MCH: 26.2 pg (ref 26.0–34.0)
MCHC: 31.6 g/dL (ref 30.0–36.0)
MCV: 82.8 fL (ref 78.0–100.0)
PLATELETS: 243 10*3/uL (ref 150–400)
RBC: 4.24 MIL/uL (ref 3.87–5.11)
RDW: 14.2 % (ref 11.5–15.5)
WBC: 10.7 10*3/uL — ABNORMAL HIGH (ref 4.0–10.5)

## 2017-04-09 LAB — BASIC METABOLIC PANEL
Anion gap: 6 (ref 5–15)
BUN: 13 mg/dL (ref 6–20)
CALCIUM: 8.1 mg/dL — AB (ref 8.9–10.3)
CO2: 25 mmol/L (ref 22–32)
Chloride: 104 mmol/L (ref 101–111)
Creatinine, Ser: 0.97 mg/dL (ref 0.44–1.00)
GFR calc Af Amer: >60 mL/min (ref >60)
GFR, EST NON AFRICAN AMERICAN: 56 mL/min — AB (ref >60)
GLUCOSE: 159 mg/dL — AB (ref 65–99)
Potassium: 4 mmol/L (ref 3.5–5.1)
Sodium: 135 mmol/L (ref 135–145)

## 2017-04-09 MED ORDER — METHOCARBAMOL 1000 MG/10ML IJ SOLN
500.0000 mg | Freq: Four times a day (QID) | INTRAVENOUS | Status: DC | PRN
  Filled 2017-04-09: qty 5

## 2017-04-09 MED ORDER — METHOCARBAMOL 750 MG PO TABS: 750.0000 mg | ORAL_TABLET | Freq: Four times a day (QID) | ORAL | Status: DC | PRN

## 2017-04-09 NOTE — Care Management Note (Signed)
Case Management Note  Patient Details  Name: Taylor Kent MRN: [id number] Date of Birth: 12-Oct-1941  Subjective/Objective:    76 yr old female s/p right total knee arthroplasty.               Action/Plan: Case manager spoke with patient concerning discharge plan and DME. Patient was preoepratively setup with [facility], no changes. She states she needs RW , she has toilet seat riser and grab bar near her toilet. Patient will have family support at discharge. CM has requested RW.    Expected Discharge Date:  04/09/17               Expected Discharge Plan:  [place]  In-House Referral:  NA  Discharge planning Services  CM Consult  Post Acute Care Choice:  Durable Medical Equipment, Home Health Choice offered to:  Patient  DME Arranged:  [facility] rolling DME Agency:  [place]:  PT [facility] Agency:  [place]  Status of Service:  Completed, signed off  If discussed at [place] of Stay Meetings, dates discussed:    Additional Comments:  Ninfa Meeker, RN 04/09/2017, 10:41 AM

## 2017-04-09 NOTE — Progress Notes (Addendum)
Subjective: 1 Day Post-Op Procedure(s) (LRB): RIGHT TOTAL KNEE ARTHROPLASTY (Right) Patient reports pain as mild.     Objective: Vital signs in last 24 hours: Temp:  [97.8 F (36.6 C)-98.6 F (37 C)] 98.6 F (37 C) (04/12 0445) Pulse Rate:  [68-90] 90 (04/12 0445) Resp:  [10-24] 15 (04/12 0445) BP: (110-183)/(58-88) 151/62 (04/12 0445) SpO2:  [95 %-100 %] 98 % (04/12 0445) Weight:  [81.6 kg (180 lb)] 81.6 kg (180 lb) (04/11 0859)  Intake/Output from previous day: 04/11 0701 - 04/12 0700 In: 1842.5 [P.O.:100; I.V.:1542.5; IV Piggyback:200] Out: 440 [Urine:400; Blood:40] Intake/Output this shift: No intake/output data recorded.   Recent Labs  04/09/17 0308  HGB 11.1*    Recent Labs  04/09/17 0308  WBC 10.7*  RBC 4.24  HCT 35.1*  PLT 243    Recent Labs  04/09/17 0308  NA 135  K 4.0  CL 104  CO2 25  BUN 13  CREATININE 0.97  GLUCOSE 159*  CALCIUM 8.1*   No results for input(s): LABPT, INR in the last 72 hours.  Neurologically intact Neurovascular intact Sensation intact distally Intact pulses distally Dorsiflexion/Plantar flexion intact Incision: dressing C/D/I No cellulitis present Compartment soft  Assessment/Plan: 1 Day Post-Op Procedure(s) (LRB): RIGHT TOTAL KNEE ARTHROPLASTY (Right) Advance diet Up with therapy D/C IV fluids Discharge home with home health after first session of PT as long as she mobilizes well WBAT RLE ABLA-mild and stable Please remove ace bandage and apply ted hose to BLE prior to d/c  Fannie Knee 04/09/2017, 7:39 AM

## 2017-04-09 NOTE — Progress Notes (Signed)
Occupational Therapy Evaluation Patient Details Name: Taylor Kent MRN: [id number] DOB: [birth year] Today's Date: 04/09/2017    History of Present Illness Pt is a 76 yo female admitted for right TKA on 04/08/17. PMH is unremarkable.    Clinical Impression   PTA, pt independent with ADL and mobility. Completed all education regarding compensatory techniques for ADL and functional mobility. Daughter present for education. Pt safe to DC home when medically stable.     Follow Up Recommendations  No OT follow up;Supervision/Assistance - 24 hour (initially)    Equipment Recommendations  None recommended by OT    Recommendations for Other Services       Precautions / Restrictions Precautions Precautions: Fall Precaution Comments: Handout given, RN from MD office reviewing with pt Restrictions Weight Bearing Restrictions: Yes RLE Weight Bearing: Weight bearing as tolerated      Mobility Bed Mobility Overal bed mobility: Needs Assistance Bed Mobility: Supine to Sit     Supine to sit: Min assist;HOB elevated     General bed mobility comments: OOB in chair  Transfers Overall transfer level: Needs assistance Equipment used: Rolling walker (2 wheeled) Transfers: Sit to/from [facility] Sit to Stand: Min guard Stand pivot transfers: Min guard       General transfer comment: vc for sequencing use of RW    Balance Overall balance assessment: Needs assistance Sitting-balance support: No upper extremity supported;Feet supported Sitting balance-Leahy Scale: Good     Standing balance support: Bilateral upper extremity supported;During functional activity Standing balance-Leahy Scale: Fair Standing balance comment: relies on RW for stability in standing                           ADL either performed or assessed with clinical judgement   ADL Overall ADL's : Needs assistance/impaired         Upper Body Bathing: Set up   Lower Body Bathing:  Minimal assistance   Upper Body Dressing : Set up   Lower Body Dressing: Minimal assistance;Sit to/from stand   Toilet Transfer: Min guard;Ambulation;RW;Comfort height toilet   Toileting- Clothing Manipulation and Hygiene: Supervision/safety;Sit to/from stand   Tub/ Banker: Medical sales representative Details (indicate cue type and reason): Educated daughter on technique Functional mobility during ADLs: Min guard;Rolling walker;Cueing for safety;Cueing for sequencing General ADL Comments: completed education regarding compensatory technqiues for ADL and funcitonal mobility for ADL, including reducing risk of falls and home safety. Pt/family verbalized understandign. Also educated pt/family on car transfser Technical sales engineer      Pertinent Vitals/Pain Pain Assessment: 0-10 Pain Score: 6  Faces Pain Scale: Hurts even more Pain Location: right knee with mobility and weight bearing Pain Descriptors / Indicators: Grimacing;Guarding;Aching Pain Intervention(s): Limited activity within patient's tolerance;Repositioned;Ice applied     Hand Dominance Right   Extremity/Trunk Assessment Upper Extremity Assessment Upper Extremity Assessment: Overall WFL for tasks assessed   Lower Extremity Assessment Lower Extremity Assessment: Defer to PT evaluation RLE Deficits / Details: Pt with normal post op pain and weakness. Did not MMT, but at least 3/5 ankle and 2/5 knee and hip per gross functional assessment RLE: Unable to fully assess due to pain   Cervical / Trunk Assessment Cervical / Trunk Assessment: Normal   Communication Communication Communication: No difficulties   Cognition Arousal/Alertness: Awake/alert Behavior During Therapy: WFL for tasks assessed/performed Overall Cognitive Status: Within  Functional Limits for tasks assessed                                     General Comments  RN from MD office  comes in at the end of this session.     Exercises     Shoulder Instructions      Home Living Family/patient expects to be discharged to:: Private residence Living Arrangements: Children Available Help at Discharge: Family;Available PRN/intermittently Type of Home: House Home Access: Stairs to enter [employer] of Steps: 2 Entrance Stairs-Rails: None Home Layout: Two level;Able to live on main level with bedroom/bathroom     Bathroom Shower/Tub: Occupational psychologist: Handicapped height Bathroom Accessibility: Yes How Accessible: Accessible via walker Home Equipment: Grab bars - tub/shower;Shower seat - built in;Toilet riser          Prior Functioning/Environment Level of Independence: Independent                 OT Problem List: Decreased strength;Decreased range of motion;Decreased activity tolerance;Impaired balance (sitting and/or standing);Decreased knowledge of use of DME or AE;Decreased knowledge of precautions;Pain      OT Treatment/Interventions:      OT Goals(Current goals can be found in the care plan section) Acute Rehab OT Goals Patient Stated Goal: to get home this afternoon OT Goal Formulation: All assessment and education complete, DC therapy  OT Frequency:     Barriers to D/C:            Co-evaluation              End of Session Equipment Utilized During Treatment: Gait belt;Rolling walker CPM Right Knee CPM Right Knee: Off Nurse Communication: Mobility status;Patient requests pain meds  Activity Tolerance: Patient tolerated treatment well Patient left: in chair;with call bell/phone within reach;with family/visitor present  OT Visit Diagnosis: Unsteadiness on feet (R26.81);Other abnormalities of gait and mobility (R26.89);Pain Pain - Right/Left: Right Pain - part of body: Knee                Time: 9371-6967 OT Time Calculation (min): 34 min Charges:  OT General Charges $OT Visit: 1 Procedure OT  Evaluation $OT Eval Low Complexity: 1 Procedure OT Treatments $Self Care/Home Management : 8-22 mins G-Codes:     [facility], OT/L  [phone] 04/09/2017  Aleeta Schmaltz,HILLARY 04/09/2017, 10:39 AM

## 2017-04-09 NOTE — Progress Notes (Signed)
Discharge paperwork and prescriptions reviewed with patient. Patient stated understanding. IV removed. Patient has family at the bedside for transportation

## 2017-04-09 NOTE — Evaluation (Signed)
Physical Therapy Evaluation Patient Details Name: Taylor Kent MRN: [id number] DOB: 24-Jul-1941 Today's Date: 04/09/2017   History of Present Illness  Pt is a 76 yo female admitted for right TKA on 04/08/17. PMH is unremarkable.   Clinical Impression  Pt is POD 1 and moving well with therapy. 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 Pt lives with her daughter in a two level home, but is able to stay on main level. Pt requires Min to Min guard A for mobility this session and will benefit from continued acute PT services in order to address below deficits before discharge home. Pt will need to perform step negotiation and review HEP prior to discharge.     Follow Up Recommendations Home health PT    Equipment Recommendations  Rolling walker with 5" wheels;3in1 (PT)    Recommendations for Other Services       Precautions / Restrictions Precautions Precautions: Fall Precaution Comments: Handout given, RN from MD office reviewing with pt Restrictions Weight Bearing Restrictions: Yes RLE Weight Bearing: Weight bearing as tolerated      Mobility  Bed Mobility Overal bed mobility: Needs Assistance Bed Mobility: Supine to Sit     Supine to sit: Min assist;HOB elevated     General bed mobility comments: Min A to bring RLE EOB, pt does attempt to mobilize self  Transfers Overall transfer level: Needs assistance Equipment used: Rolling walker (2 wheeled) Transfers: Sit to/from [facility] Sit to Stand: Min guard Stand pivot transfers: Min guard       General transfer comment: Min guard from EOB to [facility] and from [facility] to standing.   Ambulation/Gait Ambulation/Gait assistance: Min guard Ambulation Distance (Feet): 30 Feet Assistive device: Rolling walker (2 wheeled) Gait Pattern/deviations: Step-to pattern;Decreased step length - left;Decreased stance time - right;Antalgic Gait velocity: decreased Gait velocity interpretation:  Below normal speed for age/gender General Gait Details: Moderate antalgic gait with increased knee flexion RLE during stance phase. Cues for sequencing and using UE's to assist with reducing strain in RLE during stance.   Stairs            Wheelchair Mobility    Modified Rankin (Stroke Patients Only)       Balance Overall balance assessment: Needs assistance Sitting-balance support: No upper extremity supported;Feet supported Sitting balance-Leahy Scale: Good     Standing balance support: Bilateral upper extremity supported;During functional activity Standing balance-Leahy Scale: Poor Standing balance comment: relies on RW for stability in standing                             Pertinent Vitals/Pain Pain Assessment: Faces Faces Pain Scale: Hurts even more Pain Location: right knee with mobility and weight bearing Pain Descriptors / Indicators: Grimacing;Guarding Pain Intervention(s): Monitored during session;Premedicated before session;Patient requesting pain meds-RN notified;Ice applied    Home Living Family/patient expects to be discharged to:: Private residence Living Arrangements: Children Available Help at Discharge: Family;Available PRN/intermittently Type of Home: House Home Access: Stairs to enter Entrance Stairs-Rails: None Entrance Stairs-Number of Steps: 2 Home Layout: Two level;Able to live on main level with bedroom/bathroom Home Equipment: None      Prior Function Level of Independence: Independent               Hand Dominance   Dominant Hand: Right    Extremity/Trunk Assessment   Upper Extremity Assessment Upper Extremity Assessment: Defer to OT evaluation  Lower Extremity Assessment Lower Extremity Assessment: RLE deficits/detail RLE Deficits / Details: Pt with normal post op pain and weakness. Did not MMT, but at least 3/5 ankle and 2/5 knee and hip per gross functional assessment RLE: Unable to fully assess due to  pain    Cervical / Trunk Assessment Cervical / Trunk Assessment: Normal  Communication   Communication: No difficulties  Cognition Arousal/Alertness: Awake/alert Behavior During Therapy: WFL for tasks assessed/performed Overall Cognitive Status: Within Functional Limits for tasks assessed                                        General Comments General comments (skin integrity, edema, etc.): RN from MD office comes in at the end of this session.     Exercises     Assessment/Plan    PT Assessment Patient needs continued PT services  PT Problem List Decreased strength;Decreased range of motion;Decreased activity tolerance;Decreased balance;Decreased mobility;Decreased knowledge of use of DME;Pain       PT Treatment Interventions DME instruction;Gait training;Stair training;Functional mobility training;Therapeutic activities;Therapeutic exercise;Balance training;Patient/family education    PT Goals (Current goals can be found in the Care Plan section)  Acute Rehab PT Goals Patient Stated Goal: to get home this afternoon PT Goal Formulation: With patient Time For Goal Achievement: 04/16/17 Potential to Achieve Goals: Good    Frequency 7X/week   Barriers to discharge        Co-evaluation               End of Session Equipment Utilized During Treatment: Gait belt Activity Tolerance: Patient tolerated treatment well Patient left: in chair;with call bell/phone within reach;with nursing/sitter in room;with family/visitor present Nurse Communication: Mobility status PT Visit Diagnosis: Difficulty in walking, not elsewhere classified (R26.2);Pain Pain - Right/Left: Right Pain - part of body: Knee    Time: 0800-0824 PT Time Calculation (min) (ACUTE ONLY): 24 min   Charges:   PT Evaluation $PT Eval Moderate Complexity: 1 Procedure PT Treatments $Gait Training: 8-22 mins   PT G Codes:   PT G-Codes **NOT FOR INPATIENT CLASS** Functional Assessment  Tool Used: AM-PAC 6 Clicks Basic Mobility;Clinical judgement Functional Limitation: Mobility: Walking and moving around Mobility: Walking and Moving Around Current Status ([id number]): At least 40 percent but less than 60 percent impaired, limited or restricted Mobility: Walking and Moving Around Goal Status [phone]): At least 1 percent but less than 20 percent impaired, limited or restricted    Scheryl Marten PT, DPT  [phone]   Shanon Rosser 04/09/2017, 8:38 AM

## 2017-04-10 DIAGNOSIS — G610 Guillain-Barre syndrome: Secondary | ICD-10-CM | POA: Diagnosis not present

## 2017-04-10 DIAGNOSIS — Z96651 Presence of right artificial knee joint: Secondary | ICD-10-CM | POA: Diagnosis not present

## 2017-04-10 DIAGNOSIS — I10 Essential (primary) hypertension: Secondary | ICD-10-CM | POA: Diagnosis not present

## 2017-04-10 DIAGNOSIS — Z471 Aftercare following joint replacement surgery: Secondary | ICD-10-CM | POA: Diagnosis not present

## 2017-04-13 DIAGNOSIS — Z96651 Presence of right artificial knee joint: Secondary | ICD-10-CM | POA: Diagnosis not present

## 2017-04-13 DIAGNOSIS — G610 Guillain-Barre syndrome: Secondary | ICD-10-CM | POA: Diagnosis not present

## 2017-04-13 DIAGNOSIS — I10 Essential (primary) hypertension: Secondary | ICD-10-CM | POA: Diagnosis not present

## 2017-04-13 DIAGNOSIS — Z471 Aftercare following joint replacement surgery: Secondary | ICD-10-CM | POA: Diagnosis not present

## 2017-04-15 DIAGNOSIS — G610 Guillain-Barre syndrome: Secondary | ICD-10-CM | POA: Diagnosis not present

## 2017-04-15 DIAGNOSIS — Z96651 Presence of right artificial knee joint: Secondary | ICD-10-CM | POA: Diagnosis not present

## 2017-04-15 DIAGNOSIS — Z471 Aftercare following joint replacement surgery: Secondary | ICD-10-CM | POA: Diagnosis not present

## 2017-04-15 DIAGNOSIS — I10 Essential (primary) hypertension: Secondary | ICD-10-CM | POA: Diagnosis not present

## 2017-04-17 DIAGNOSIS — G610 Guillain-Barre syndrome: Secondary | ICD-10-CM | POA: Diagnosis not present

## 2017-04-17 DIAGNOSIS — Z96651 Presence of right artificial knee joint: Secondary | ICD-10-CM | POA: Diagnosis not present

## 2017-04-17 DIAGNOSIS — I10 Essential (primary) hypertension: Secondary | ICD-10-CM | POA: Diagnosis not present

## 2017-04-17 DIAGNOSIS — Z471 Aftercare following joint replacement surgery: Secondary | ICD-10-CM | POA: Diagnosis not present

## 2017-04-20 DIAGNOSIS — I10 Essential (primary) hypertension: Secondary | ICD-10-CM | POA: Diagnosis not present

## 2017-04-20 DIAGNOSIS — Z96651 Presence of right artificial knee joint: Secondary | ICD-10-CM | POA: Diagnosis not present

## 2017-04-20 DIAGNOSIS — G610 Guillain-Barre syndrome: Secondary | ICD-10-CM | POA: Diagnosis not present

## 2017-04-20 DIAGNOSIS — Z471 Aftercare following joint replacement surgery: Secondary | ICD-10-CM | POA: Diagnosis not present

## 2017-04-21 DIAGNOSIS — M1711 Unilateral primary osteoarthritis, right knee: Secondary | ICD-10-CM | POA: Diagnosis not present

## 2017-04-23 DIAGNOSIS — M1711 Unilateral primary osteoarthritis, right knee: Secondary | ICD-10-CM | POA: Diagnosis not present

## 2017-04-27 DIAGNOSIS — M1711 Unilateral primary osteoarthritis, right knee: Secondary | ICD-10-CM | POA: Diagnosis not present

## 2017-04-29 DIAGNOSIS — M1711 Unilateral primary osteoarthritis, right knee: Secondary | ICD-10-CM | POA: Diagnosis not present

## 2017-05-01 DIAGNOSIS — M1711 Unilateral primary osteoarthritis, right knee: Secondary | ICD-10-CM | POA: Diagnosis not present

## 2017-05-05 DIAGNOSIS — M1711 Unilateral primary osteoarthritis, right knee: Secondary | ICD-10-CM | POA: Diagnosis not present

## 2017-05-07 DIAGNOSIS — M1711 Unilateral primary osteoarthritis, right knee: Secondary | ICD-10-CM | POA: Diagnosis not present

## 2017-05-19 DIAGNOSIS — M1711 Unilateral primary osteoarthritis, right knee: Secondary | ICD-10-CM | POA: Diagnosis not present

## 2017-05-26 ENCOUNTER — Other Ambulatory Visit: Payer: Self-pay | Admitting: Internal Medicine

## 2017-05-26 DIAGNOSIS — Z1231 Encounter for screening mammogram for malignant neoplasm of breast: Secondary | ICD-10-CM

## 2017-05-26 DIAGNOSIS — M9903 Segmental and somatic dysfunction of lumbar region: Secondary | ICD-10-CM | POA: Diagnosis not present

## 2017-05-26 DIAGNOSIS — M5116 Intervertebral disc disorders with radiculopathy, lumbar region: Secondary | ICD-10-CM | POA: Diagnosis not present

## 2017-06-10 DIAGNOSIS — M5116 Intervertebral disc disorders with radiculopathy, lumbar region: Secondary | ICD-10-CM | POA: Diagnosis not present

## 2017-06-10 DIAGNOSIS — M9903 Segmental and somatic dysfunction of lumbar region: Secondary | ICD-10-CM | POA: Diagnosis not present

## 2017-07-06 ENCOUNTER — Ambulatory Visit
Admission: RE | Admit: 2017-07-06 | Discharge: 2017-07-06 | Disposition: A | Discharge status: Home / Self Care | Payer: Medicare Other | Source: Ambulatory Visit | Attending: Internal Medicine | Admitting: Internal Medicine

## 2017-07-06 DIAGNOSIS — Z1231 Encounter for screening mammogram for malignant neoplasm of breast: Secondary | ICD-10-CM | POA: Diagnosis not present

## 2017-08-19 ENCOUNTER — Encounter (HOSPITAL_COMMUNITY): Payer: Self-pay | Admitting: Orthopedic Surgery

## 2017-11-17 DIAGNOSIS — M1711 Unilateral primary osteoarthritis, right knee: Secondary | ICD-10-CM | POA: Diagnosis not present

## 2017-12-01 DIAGNOSIS — E039 Hypothyroidism, unspecified: Secondary | ICD-10-CM | POA: Diagnosis not present

## 2017-12-01 DIAGNOSIS — Z79899 Other long term (current) drug therapy: Secondary | ICD-10-CM | POA: Diagnosis not present

## 2017-12-01 DIAGNOSIS — E2839 Other primary ovarian failure: Secondary | ICD-10-CM | POA: Diagnosis not present

## 2017-12-01 DIAGNOSIS — M545 Low back pain: Secondary | ICD-10-CM | POA: Diagnosis not present

## 2017-12-01 DIAGNOSIS — I10 Essential (primary) hypertension: Secondary | ICD-10-CM | POA: Diagnosis not present

## 2017-12-01 DIAGNOSIS — E559 Vitamin D deficiency, unspecified: Secondary | ICD-10-CM | POA: Diagnosis not present

## 2017-12-01 DIAGNOSIS — E7800 Pure hypercholesterolemia, unspecified: Secondary | ICD-10-CM | POA: Diagnosis not present

## 2017-12-01 DIAGNOSIS — R5383 Other fatigue: Secondary | ICD-10-CM | POA: Diagnosis not present

## 2018-02-18 ENCOUNTER — Encounter: Payer: Self-pay | Admitting: Internal Medicine

## 2018-04-08 ENCOUNTER — Ambulatory Visit (INDEPENDENT_AMBULATORY_CARE_PROVIDER_SITE_OTHER): Payer: Medicare Other | Admitting: Internal Medicine

## 2018-04-08 ENCOUNTER — Other Ambulatory Visit (INDEPENDENT_AMBULATORY_CARE_PROVIDER_SITE_OTHER): Payer: Medicare Other

## 2018-04-08 ENCOUNTER — Encounter: Payer: Self-pay | Admitting: Internal Medicine

## 2018-04-08 VITALS — BP 130/64 | HR 80 | Ht 66.0 in | Wt 173.2 lb

## 2018-04-08 DIAGNOSIS — Z1211 Encounter for screening for malignant neoplasm of colon: Secondary | ICD-10-CM

## 2018-04-08 DIAGNOSIS — D509 Iron deficiency anemia, unspecified: Secondary | ICD-10-CM

## 2018-04-08 DIAGNOSIS — Z52008 Unspecified donor, other blood: Secondary | ICD-10-CM

## 2018-04-08 DIAGNOSIS — K219 Gastro-esophageal reflux disease without esophagitis: Secondary | ICD-10-CM | POA: Diagnosis not present

## 2018-04-08 LAB — CBC WITH DIFFERENTIAL/PLATELET
Basophils Absolute: 0 10*3/uL (ref 0.0–0.1)
Basophils Relative: 0.7 % (ref 0.0–3.0)
EOS PCT: 2.6 % (ref 0.0–5.0)
Eosinophils Absolute: 0.2 10*3/uL (ref 0.0–0.7)
HCT: 42.3 % (ref 36.0–46.0)
Hemoglobin: 13.7 g/dL (ref 12.0–15.0)
LYMPHS ABS: 1.8 10*3/uL (ref 0.7–4.0)
Lymphocytes Relative: 28 % (ref 12.0–46.0)
MCHC: 32.4 g/dL (ref 30.0–36.0)
MCV: 77.8 fl — AB (ref 78.0–100.0)
Monocytes Absolute: 0.6 10*3/uL (ref 0.1–1.0)
Monocytes Relative: 9.2 % (ref 3.0–12.0)
NEUTROS PCT: 59.5 % (ref 43.0–77.0)
Neutro Abs: 3.8 10*3/uL (ref 1.4–7.7)
Platelets: 287 10*3/uL (ref 150.0–400.0)
RBC: 5.43 Mil/uL — AB (ref 3.87–5.11)
RDW: 25 % — ABNORMAL HIGH (ref 11.5–15.5)
WBC: 6.4 10*3/uL (ref 4.0–10.5)

## 2018-04-08 LAB — VITAMIN B12: Vitamin B-12: 394 pg/mL (ref 211–911)

## 2018-04-08 LAB — FERRITIN: FERRITIN: 23.7 ng/mL (ref 10.0–291.0)

## 2018-04-08 NOTE — Progress Notes (Signed)
Taylor Kent 77 y.o. October 19, 1941 [id number]  Assessment & Plan:   Encounter Diagnoses  Name Primary?  . Microcytic anemia Yes  . Gastroesophageal reflux disease, esophagitis presence not specified   . Blood donor   . Colon cancer screening      The most likely diagnosis is that she is iron deficient and anemic related to chronic blood donation.  NSAID use could be contributing to some leakage of blood.  Colorectal neoplasia is possible but again we have a known chronic blood loss in the form of donation.   Plans are: Stay on OTC Nexium, particularly since she uses some NSAIDs still for arthritis pain.  This will treat her GERD is well because she says she misses it. Ferrous gluconate, continue this supplement CBC, ferritin, B12 will be checked EGD to evaluate GERD and look for capital NSAID damage, and a screening colonoscopy will be done as well.   The risks and benefits as well as alternatives of endoscopic procedure(s) have been discussed and reviewed. All questions answered. The patient agrees to proceed.  I appreciate the opportunity to care for this patient. 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  Platelets and white count were normal.  The patient is a chronic blood donor having donated over 5 gallons of blood in her lifetime and she is donated blood 3 times in 2018 and 3 times in 2017.  She went to donate blood again in February and was told her hemoglobin was still in the 10 range and she was declined.  She self started ferrous gluconate.  She takes Nexium for heartburn and she has been holding that some, she was using ibuprofen or Advil several times a day at times had backed off of that but is still taking it once a  day many days.  Denies any rectal bleeding change in bowel habits or hematochezia or melena.  Perhaps some fatigue.  She does eat red meat but not so much in the way of green leafy vegetables that would contain iron from a cursory dietary history.  GI review of systems is otherwise negative except for some bloating in itself diagnosis of irritable bowel syndrome and belching. Allergies  Allergen Reactions  . Fluzone [Flu Virus Vaccine] Other (See Comments)    GUILLIAN BARRE  . Prevacid [Lansoprazole] Shortness Of Breath    Dizzy   . Shellfish Allergy Other (See Comments)    Increased heart rate flushing   . Sulfa Antibiotics Hives  . Silver Rash    Rash    Current Meds  Medication Sig  . Aliskiren-Hydrochlorothiazide (TEKTURNA HCT) 300-12.5 MG TABS Take 0.5 tablets by mouth daily. Patient takes 1/2 of 300-125 mg  . Esomeprazole Magnesium 20 MG TBEC Take by mouth daily.  Marland Kitchen estrogens-methylTEST (ESTRATEST) 1.25-2.5 MG tablet Take 1 tablet by mouth daily.  . Ferrous Gluconate (IRON) 240 (27 Fe) MG TABS Take by mouth daily.  . methocarbamol (ROBAXIN) 750 MG tablet Take 750 mg by mouth daily as needed for muscle spasms.  . montelukast (SINGULAIR) 10 MG tablet Take 10 mg by mouth daily.   Past Medical History:  Diagnosis Date  . Arthritis   . Cancer ([place])    basal cell - nose  .  GERD (gastroesophageal reflux disease)   . Guillain Barr syndrome ([place]) 2015   after flu shot  . Hypertension   . IBS (irritable bowel syndrome)   . Shingles 2015   Past Surgical History:  Procedure Laterality Date  . ABDOMINAL HYSTERECTOMY  1979   vaginal  . BLADDER SUSPENSION  1987  . BREAST SURGERY Right 1982   biospy  . CERVICAL POLYPECTOMY Bilateral   . CHOLECYSTECTOMY    . TOTAL KNEE ARTHROPLASTY Right 04/08/2017  . TOTAL KNEE ARTHROPLASTY Right 04/08/2017   Procedure: RIGHT TOTAL KNEE ARTHROPLASTY;  Surgeon: [name], MD;  Location: [facility];  Service: Orthopedics;  Laterality: Right;    . 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   Review of Systems Arthritis and back pain all other review of systems negative or as per HPI  Objective:  @BP  130/64   Pulse 80   Ht 5\' 6"  (1.676 m)   Wt 173 lb 4 oz (78.6 kg)   BMI 27.96 kg/m @  General:  Well-developed, well-nourished and in no acute distress Eyes:  anicteric. Neck:   supple w/o thyromegaly or mass.  Lungs: Clear to auscultation bilaterally. Heart:  S1S2, no rubs, murmurs, gallops. Abdomen:  soft, non-tender, no hepatosplenomegaly, hernia, or mass and BS+.  Rectal: Deferred until colonoscopy Lymph:  no cervical or supraclavicular adenopathy. Extremities:   no edema, cyanosis or clubbing Skin   no rash. Neuro:  A&O x 3.  Psych:  appropriate mood and  Affect.   Data Reviewed:  See HPI

## 2018-04-08 NOTE — Patient Instructions (Addendum)
You have been scheduled for an endoscopy and colonoscopy. Please follow the written instructions given to you at your visit today. Please pick up your prep supplies at the pharmacy. If you use inhalers (even only as needed), please bring them with you on the day of your procedure.  Your provider has requested that you go to the basement level for lab work before leaving today. Press "B" on the elevator. The lab is located at the first door on the left as you exit the elevator.   Hold your iron 3 days prior to your procedure.  You may have a light breakfast the morning of prep day (the day before the procedure).   You may choose from one of the following items: eggs and toast OR chicken noodle soup and crackers.   You should have your breakfast completed between 8:00 and 9:00 am the day before your procedure.    After you have had your light breakfast you should start a clear liquid diet only, NO SOLIDS. No additional solid food is allowed. You may continue to have clear liquid up to 3 hours prior to your procedure.      No more blood donations please.   I appreciate the opportunity to care for you. Silvano Rusk, MD, [facility]     .

## 2018-04-09 NOTE — Progress Notes (Signed)
My Chart message Re NL Hgb B12 ferrition Stay on iron

## 2018-06-15 ENCOUNTER — Encounter: Payer: Self-pay | Admitting: Internal Medicine

## 2018-06-15 ENCOUNTER — Ambulatory Visit (INDEPENDENT_AMBULATORY_CARE_PROVIDER_SITE_OTHER): Payer: Medicare Other | Admitting: Internal Medicine

## 2018-06-15 ENCOUNTER — Other Ambulatory Visit: Payer: Self-pay

## 2018-06-15 VITALS — BP 113/50 | HR 64 | Temp 97.8°F | Resp 12 | Ht 66.0 in | Wt 173.0 lb

## 2018-06-15 DIAGNOSIS — I10 Essential (primary) hypertension: Secondary | ICD-10-CM | POA: Diagnosis not present

## 2018-06-15 DIAGNOSIS — Z1211 Encounter for screening for malignant neoplasm of colon: Secondary | ICD-10-CM | POA: Diagnosis not present

## 2018-06-15 DIAGNOSIS — K319 Disease of stomach and duodenum, unspecified: Secondary | ICD-10-CM | POA: Diagnosis not present

## 2018-06-15 DIAGNOSIS — K219 Gastro-esophageal reflux disease without esophagitis: Secondary | ICD-10-CM

## 2018-06-15 DIAGNOSIS — K635 Polyp of colon: Secondary | ICD-10-CM | POA: Diagnosis not present

## 2018-06-15 DIAGNOSIS — D125 Benign neoplasm of sigmoid colon: Secondary | ICD-10-CM | POA: Diagnosis not present

## 2018-06-15 DIAGNOSIS — K2970 Gastritis, unspecified, without bleeding: Secondary | ICD-10-CM | POA: Diagnosis not present

## 2018-06-15 MED ORDER — SODIUM CHLORIDE 0.9 % IV SOLN: 500.0000 mL | Freq: Once | INTRAVENOUS | Status: DC

## 2018-06-15 NOTE — Patient Instructions (Addendum)
On the EGD I took a biopsy to see if H pylori is present and biopsies of a polyp in the duodenal bulb.  There was a tiny colon polyp removed during the colonoscopy and I saw 2 tiny AVM's - dilated blood vessels on the surface of the colon. These can leak blood and may have contributed to the low Hgb and iron.  We can ablate them but I reserve that for when I know they are causing a problem - and the equipment that best accomplishes that is at the hospital. I doubt they will be an issue.  You also have a condition called diverticulosis - common and not usually a problem. Please read the handout provided.  I appreciate the opportunity to care for you. Gatha Mayer, MD, [facility]  Handouts given:  Polyps and Diverticulosis YOU HAD AN ENDOSCOPIC PROCEDURE TODAY AT [place]:   Refer to the procedure report that was given to you for any specific questions about what was found during the examination.  If the procedure report does not answer your questions, please call your gastroenterologist to clarify.  If you requested that your care partner not be given the details of your procedure findings, then the procedure report has been included in a sealed envelope for you to review at your convenience later.  YOU SHOULD EXPECT: Some feelings of bloating in the abdomen. Passage of more gas than usual.  Walking can help get rid of the air that was put into your GI tract during the procedure and reduce the bloating. If you had a lower endoscopy (such as a colonoscopy or flexible sigmoidoscopy) you may notice spotting of blood in your stool or on the toilet paper. If you underwent a bowel prep for your procedure, you may not have a normal bowel movement for a few days.  Please Note:  You might notice some irritation and congestion in your nose or some drainage.  This is from the oxygen used during your procedure.  There is no need for concern and it should clear up in a day or  so.  SYMPTOMS TO REPORT IMMEDIATELY:   Following lower endoscopy (colonoscopy or flexible sigmoidoscopy):  Excessive amounts of blood in the stool  Significant tenderness or worsening of abdominal pains  Swelling of the abdomen that is new, acute  Fever of 100F or higher   Following upper endoscopy (EGD)  Vomiting of blood or coffee ground material  New chest pain or pain under the shoulder blades  Painful or persistently difficult swallowing  New shortness of breath  Fever of 100F or higher  Black, tarry-looking stools  For urgent or emergent issues, a gastroenterologist can be reached at any hour by calling [phone].   DIET:  We do recommend a small meal at first, but then you may proceed to your regular diet.  Drink plenty of fluids but you should avoid alcoholic beverages for 24 hours.  ACTIVITY:  You should plan to take it easy for the rest of today and you should NOT DRIVE or use heavy machinery until tomorrow (because of the sedation medicines used during the test).    FOLLOW UP: Our staff will call the number listed on your records the next business day following your procedure to check on you and address any questions or concerns that you may have regarding the information given to you following your procedure. If we do not reach you, we will leave a message.  However, if you  are feeling well and you are not experiencing any problems, there is no need to return our call.  We will assume that you have returned to your regular daily activities without incident.  If any biopsies were taken you will be contacted by phone or by letter within the next 1-3 weeks.  Please call us at [phone] if you have not heard about the biopsies in 3 weeks.    SIGNATURES/CONFIDENTIALITY: You and/or your care partner have signed paperwork which will be entered into your electronic medical record.  These signatures attest to the fact that that the information above on your After  Visit Summary has been reviewed and is understood.  Full responsibility of the confidentiality of this discharge information lies with you and/or your care-partner.

## 2018-06-15 NOTE — Progress Notes (Signed)
Called to room to assist during endoscopic procedure.  Patient ID and intended procedure confirmed with present staff. Received instructions for my participation in the procedure from the performing physician.  

## 2018-06-15 NOTE — Op Note (Signed)
[place] Patient Name: Taylor Kent Procedure Date: 06/15/2018 1:16 PM MRN: [id number] Endoscopist: Gatha Mayer , MD Age: 77 Referring MD:  Date of Birth: 04/02/1941 Gender: Female Account #: [id number] Procedure:                Colonoscopy Indications:              Screening for colorectal malignant neoplasm Medicines:                Propofol per Anesthesia, Monitored Anesthesia Care Procedure:                Pre-Anesthesia Assessment:                           - Prior to the procedure, a History and Physical                            was performed, and patient medications and                            allergies were reviewed. The patient's tolerance of                            previous anesthesia was also reviewed. The risks                            and benefits of the procedure and the sedation                            options and risks were discussed with the patient.                            All questions were answered, and informed consent                            was obtained. Prior Anticoagulants: The patient has                            taken no previous anticoagulant or antiplatelet                            agents. ASA Grade Assessment: II - A patient with                            mild systemic disease. After reviewing the risks                            and benefits, the patient was deemed in                            satisfactory condition to undergo the procedure.                           - Prior to the procedure, a History and Physical  was performed, and patient medications and                            allergies were reviewed. The patient's tolerance of                            previous anesthesia was also reviewed. The risks                            and benefits of the procedure and the sedation                            options and risks were discussed with the patient.   All questions were answered, and informed consent                            was obtained. Prior Anticoagulants: The patient has                            taken no previous anticoagulant or antiplatelet                            agents. ASA Grade Assessment: II - A patient with                            mild systemic disease. After reviewing the risks                            and benefits, the patient was deemed in                            satisfactory condition to undergo the procedure.                           After obtaining informed consent, the colonoscope                            was passed under direct vision. Throughout the                            procedure, the patient's blood pressure, pulse, and                            oxygen saturations were monitored continuously. The                            Colonoscope was introduced through the anus and                            advanced to the the cecum, identified by                            appendiceal orifice and ileocecal valve. The  colonoscopy was performed without difficulty. The                            patient tolerated the procedure well. The quality                            of the bowel preparation was good. The terminal                            ileum, the appendiceal orifice and the rectum were                            photographed. The bowel preparation used was                            Miralax. Scope In: 1:37:52 PM Scope Out: 1:50:22 PM Scope Withdrawal Time: 0 hours 9 minutes 25 seconds  Total Procedure Duration: 0 hours 12 minutes 30 seconds  Findings:                 A 3 mm polyp was found in the sigmoid colon. The                            polyp was sessile. The polyp was removed with a                            cold snare. Resection and retrieval were complete.                            Verification of patient identification for the                             specimen was done. Estimated blood loss was minimal.                           Two small localized angiodysplastic lesions without                            bleeding were found in the ascending colon.                           Multiple diverticula were found in the sigmoid                            colon.                           The exam was otherwise without abnormality on                            direct and retroflexion views.                           The terminal ileum appeared normal. Complications:  No immediate complications. Estimated Blood Loss:     Estimated blood loss was minimal. Impression:               - One 3 mm polyp in the sigmoid colon, removed with                            a cold snare. Resected and retrieved.                           - Two non-bleeding colonic angiodysplastic lesions.                            Ascending colon She had iron-deficiency after years                            of blood donation - the angiodysplasia could have                            contributed - she has responded to iron                            supplementation                           - Diverticulosis in the sigmoid colon.                           - The examination was otherwise normal on direct                            and retroflexion views. Recommendation:           - Patient has a contact number available for                            emergencies. The signs and symptoms of potential                            delayed complications were discussed with the                            patient. Return to normal activities tomorrow.                            Written discharge instructions were provided to the                            patient.                           - Resume previous diet.                           - Continue present medications.                           -  Await pathology results.                           - No repeat colonoscopy due to  age. Gatha Mayer, MD 06/15/2018 2:10:17 PM This report has been signed electronically.

## 2018-06-15 NOTE — Op Note (Signed)
[place] Patient Name: Eily Louvier Procedure Date: 06/15/2018 1:15 PM MRN: [id number] Endoscopist: Gatha Mayer , MD Age: 77 Referring MD:  Date of Birth: 1941-04-04 Gender: Female Account #: [id number] Procedure:                Upper GI endoscopy Indications:              Heartburn, Suspected esophageal reflux, NSAID use Medicines:                Propofol per Anesthesia, Monitored Anesthesia Care Procedure:                Pre-Anesthesia Assessment:                           - Prior to the procedure, a History and Physical                            was performed, and patient medications and                            allergies were reviewed. The patient's tolerance of                            previous anesthesia was also reviewed. The risks                            and benefits of the procedure and the sedation                            options and risks were discussed with the patient.                            All questions were answered, and informed consent                            was obtained. Prior Anticoagulants: The patient has                            taken no previous anticoagulant or antiplatelet                            agents. ASA Grade Assessment: II - A patient with                            mild systemic disease. After reviewing the risks                            and benefits, the patient was deemed in                            satisfactory condition to undergo the procedure.                           After obtaining informed consent, the endoscope was  passed under direct vision. Throughout the                            procedure, the patient's blood pressure, pulse, and                            oxygen saturations were monitored continuously. The                            Endoscope was introduced through the mouth, and                            advanced to the second part of duodenum. The upper                             GI endoscopy was accomplished without difficulty.                            The patient tolerated the procedure well. Scope In: Scope Out: Findings:                 The Z-line was variable and was found at the                            gastroesophageal junction. Columnar appearing                            mucosa - < 10 mm maximal dimension so no biopsy                           Patchy mildly erythematous mucosa without bleeding                            was found in the gastric antrum. Biopsies were                            taken with a cold forceps for Helicobacter pylori                            testing using CLOtest. Verification of patient                            identification for the specimen was done. Estimated                            blood loss was minimal.                           A single 8 to 10 mm semi-sessile polyp was found in                            the duodenal bulb. Biopsies were taken with a cold  forceps for histology. Verification of patient                            identification for the specimen was done. Estimated                            blood loss was minimal.                           The exam was otherwise without abnormality.                           The cardia and gastric fundus were normal on                            retroflexion. Complications:            No immediate complications. Estimated Blood Loss:     Estimated blood loss was minimal. Impression:               - Z-line variable, at the gastroesophageal                            junction. < 1 cm, no biopsy                           - Erythematous mucosa in the antrum. Biopsied. CLO                            test ( has been on esoemprazole OTC so could be                            negative)                           - A single duodenal polyp. Biopsied.                           - The examination was otherwise  normal. Recommendation:           - Patient has a contact number available for                            emergencies. The signs and symptoms of potential                            delayed complications were discussed with the                            patient. Return to normal activities tomorrow.                            Written discharge instructions were provided to the                            patient.                           -  Continue present medications.                           - Await pathology results.                           - Resume previous diet.                           - See the other procedure note for documentation of                            additional recommendations. Gatha Mayer, MD 06/15/2018 2:05:15 PM This report has been signed electronically.

## 2018-06-15 NOTE — Progress Notes (Signed)
Report given to PACU, vss 

## 2018-06-16 ENCOUNTER — Telehealth: Payer: Self-pay

## 2018-06-16 LAB — HELICOBACTER PYLORI SCREEN-BIOPSY: UREASE: NEGATIVE

## 2018-06-16 NOTE — Telephone Encounter (Signed)
  Follow up Call-  Call back number 06/15/2018  Post procedure Call Back phone  # [phone]  Permission to leave phone message Yes  Some recent data might be hidden     Patient questions:  Do you have a fever, pain , or abdominal swelling? No. Pain Score  0 *  Have you tolerated food without any problems? Yes.    Have you been able to return to your normal activities? Yes.    Do you have any questions about your discharge instructions: Diet   No. Medications  No. Follow up visit  No.  Do you have questions or concerns about your Care? No.  Actions: * If pain score is 4 or above: No action needed, pain <4.

## 2018-06-18 ENCOUNTER — Other Ambulatory Visit: Payer: Self-pay | Admitting: Emergency Medicine

## 2018-06-18 ENCOUNTER — Other Ambulatory Visit: Payer: Self-pay | Admitting: Internal Medicine

## 2018-06-18 DIAGNOSIS — Z1231 Encounter for screening mammogram for malignant neoplasm of breast: Secondary | ICD-10-CM

## 2018-06-20 ENCOUNTER — Encounter: Payer: Self-pay | Admitting: Internal Medicine

## 2018-06-20 ENCOUNTER — Other Ambulatory Visit: Payer: Self-pay | Admitting: Internal Medicine

## 2018-06-20 DIAGNOSIS — D509 Iron deficiency anemia, unspecified: Secondary | ICD-10-CM

## 2018-06-20 DIAGNOSIS — K5520 Angiodysplasia of colon without hemorrhage: Secondary | ICD-10-CM

## 2018-06-20 DIAGNOSIS — D500 Iron deficiency anemia secondary to blood loss (chronic): Secondary | ICD-10-CM

## 2018-06-20 HISTORY — DX: Angiodysplasia of colon without hemorrhage: K55.20

## 2018-06-20 HISTORY — DX: Iron deficiency anemia, unspecified: D50.9

## 2018-06-20 NOTE — Progress Notes (Signed)
Also no H pylori

## 2018-06-20 NOTE — Progress Notes (Signed)
  Duodenal biopsies show inflammation/hyperplastic polyp changes Sigmoid polyp hyperplastic  No recall needed  My Chart note to patient - stay on current tx and get CBC and ferritin in early September Lab orders entered

## 2018-07-08 ENCOUNTER — Ambulatory Visit
Admission: RE | Admit: 2018-07-08 | Discharge: 2018-07-08 | Disposition: A | Discharge status: Home / Self Care | Payer: Medicare Other | Source: Ambulatory Visit | Attending: Internal Medicine | Admitting: Internal Medicine

## 2018-07-08 DIAGNOSIS — Z1231 Encounter for screening mammogram for malignant neoplasm of breast: Secondary | ICD-10-CM | POA: Diagnosis not present

## 2018-08-27 IMAGING — CR DG CHEST 2V
2 series · 2 of 2 positions shown · non-contrast
Comparison: Chest x-ray of July 23, 2015

CLINICAL DATA: Preoperative examination prior to the arthroplasty.
No current chest complaints. History of hypertension. Nonsmoker.

EXAM:
CHEST  2 VIEW

[w chest pa]
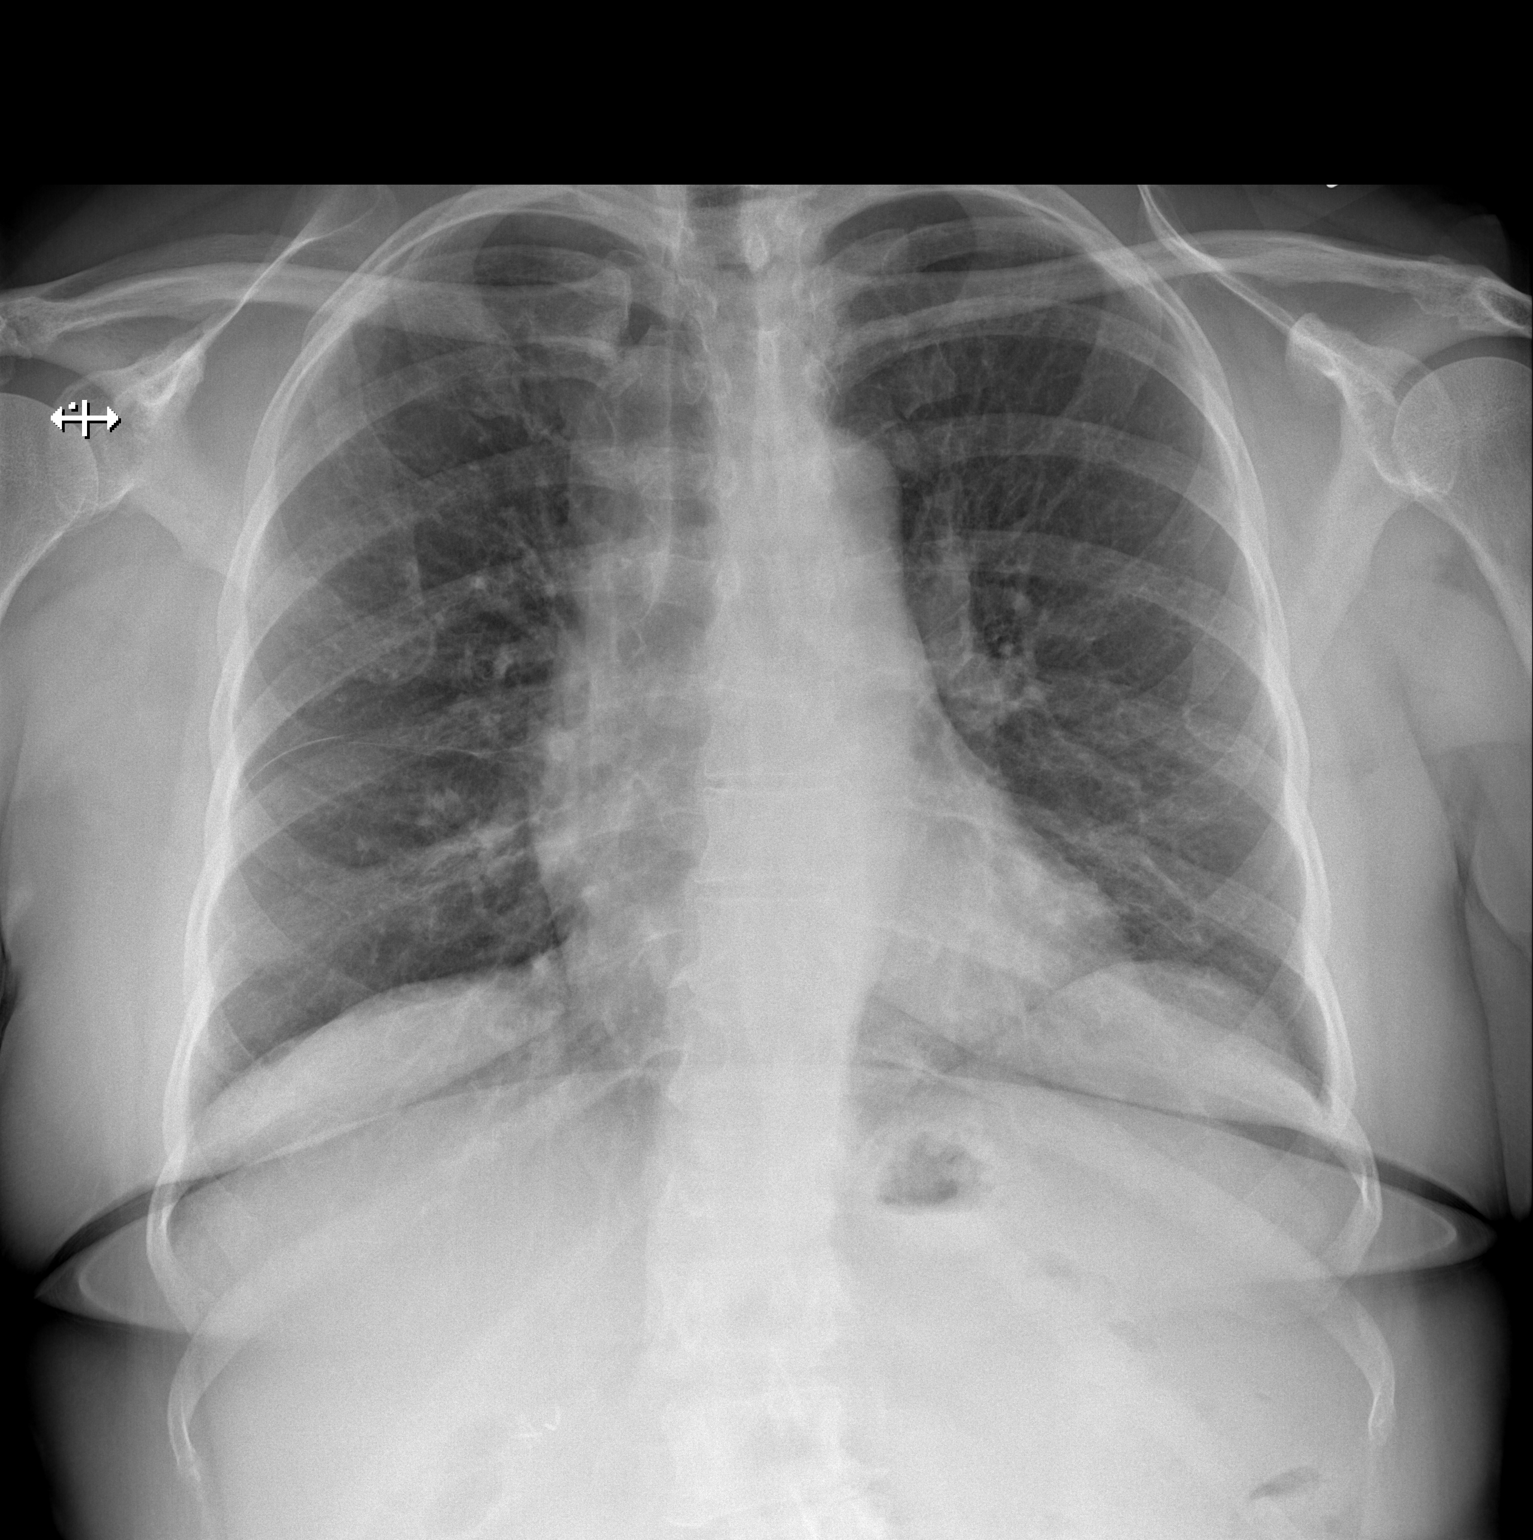

[w chest lat]
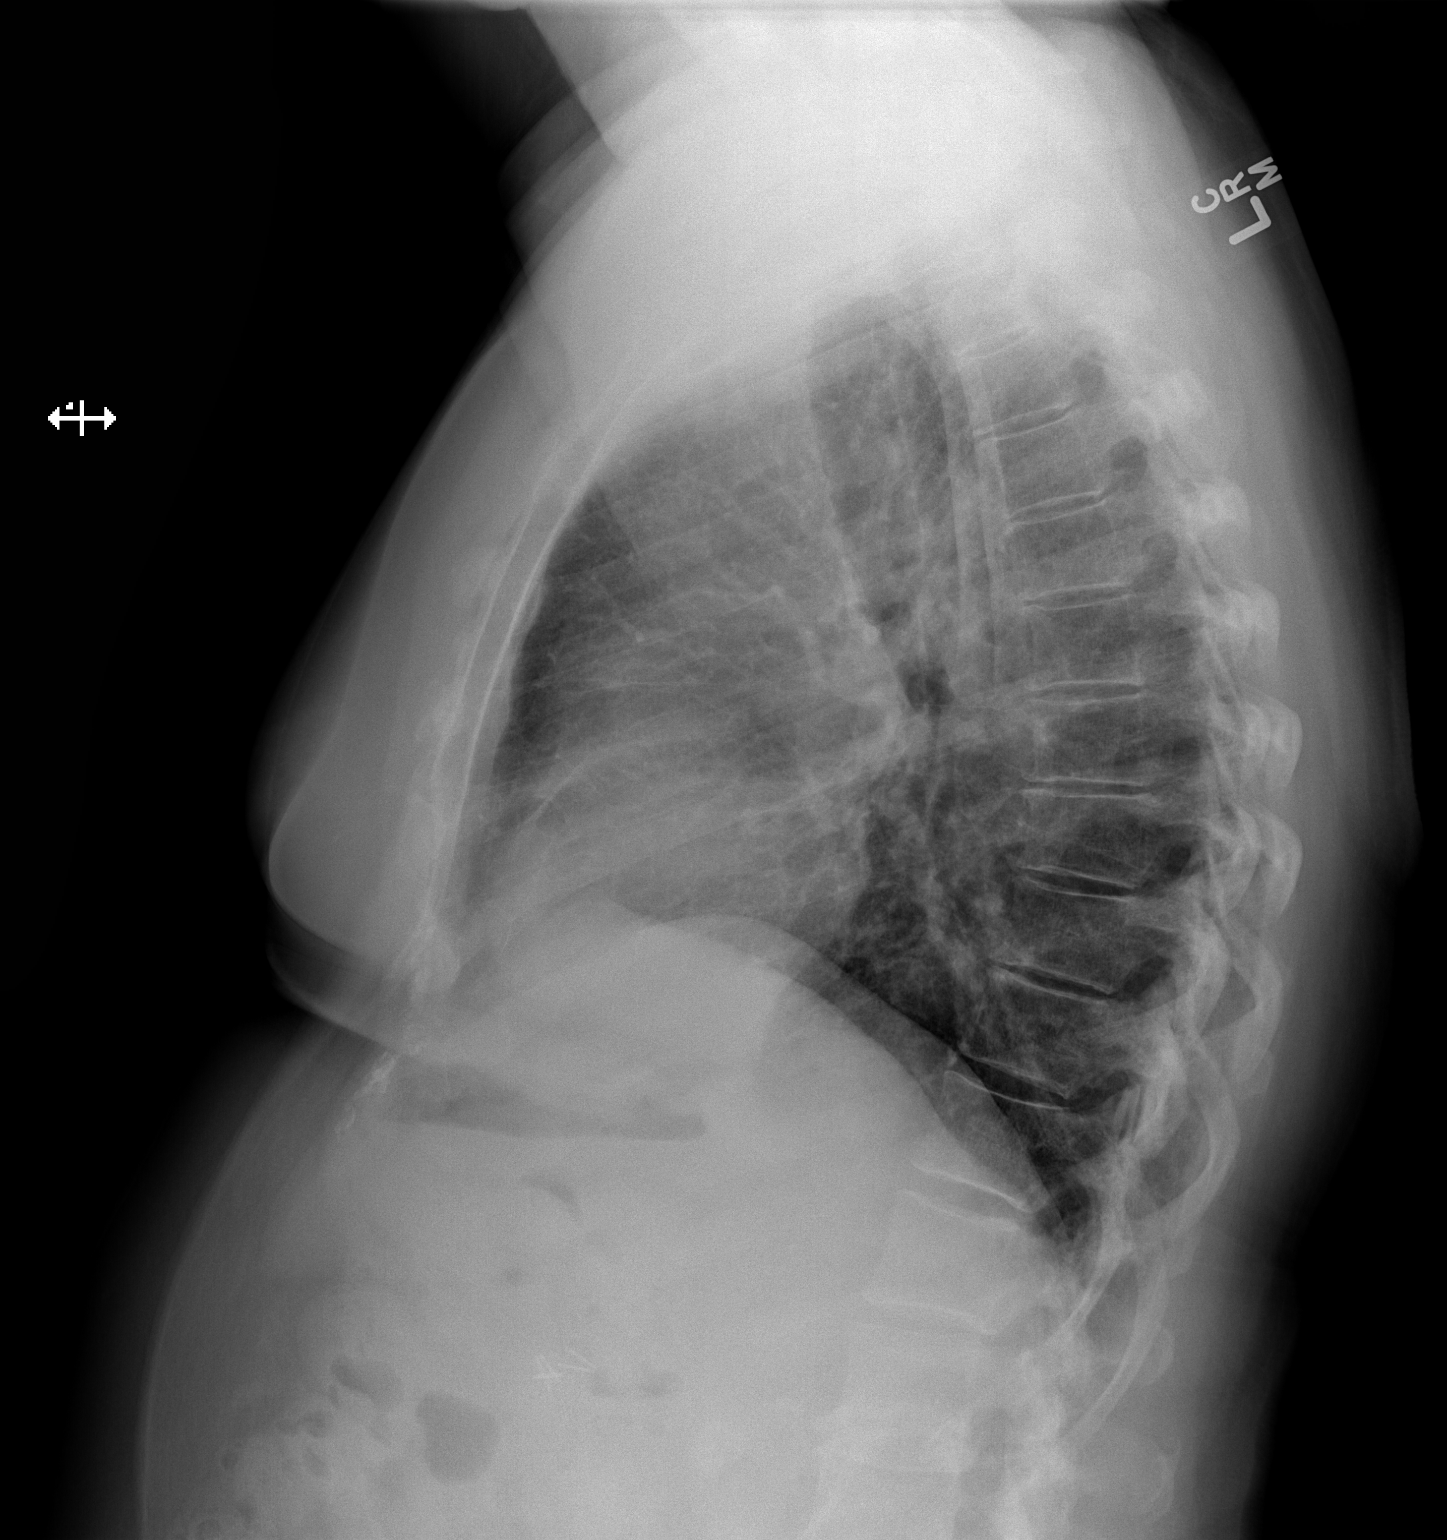

[2 of 2 positions shown; findings below may reference images not displayed]

FINDINGS: The lungs are adequately inflated. The interstitial markings are
coarse though stable. The heart and pulmonary vascularity are
normal. The mediastinum is normal in width. The trachea is midline.
The bony thorax exhibits no acute abnormality.
IMPRESSION: There is no pneumonia, CHF, nor other acute cardiopulmonary
abnormality.

## 2018-09-05 IMAGING — DX DG KNEE 1-2V PORT*R*
2 series · 2 of 2 positions shown · non-contrast
Comparison: MRI 07/23/2015.

CLINICAL DATA: Knee arthroplasty

EXAM:
PORTABLE RIGHT KNEE - 1-2 VIEW

[knee ap]
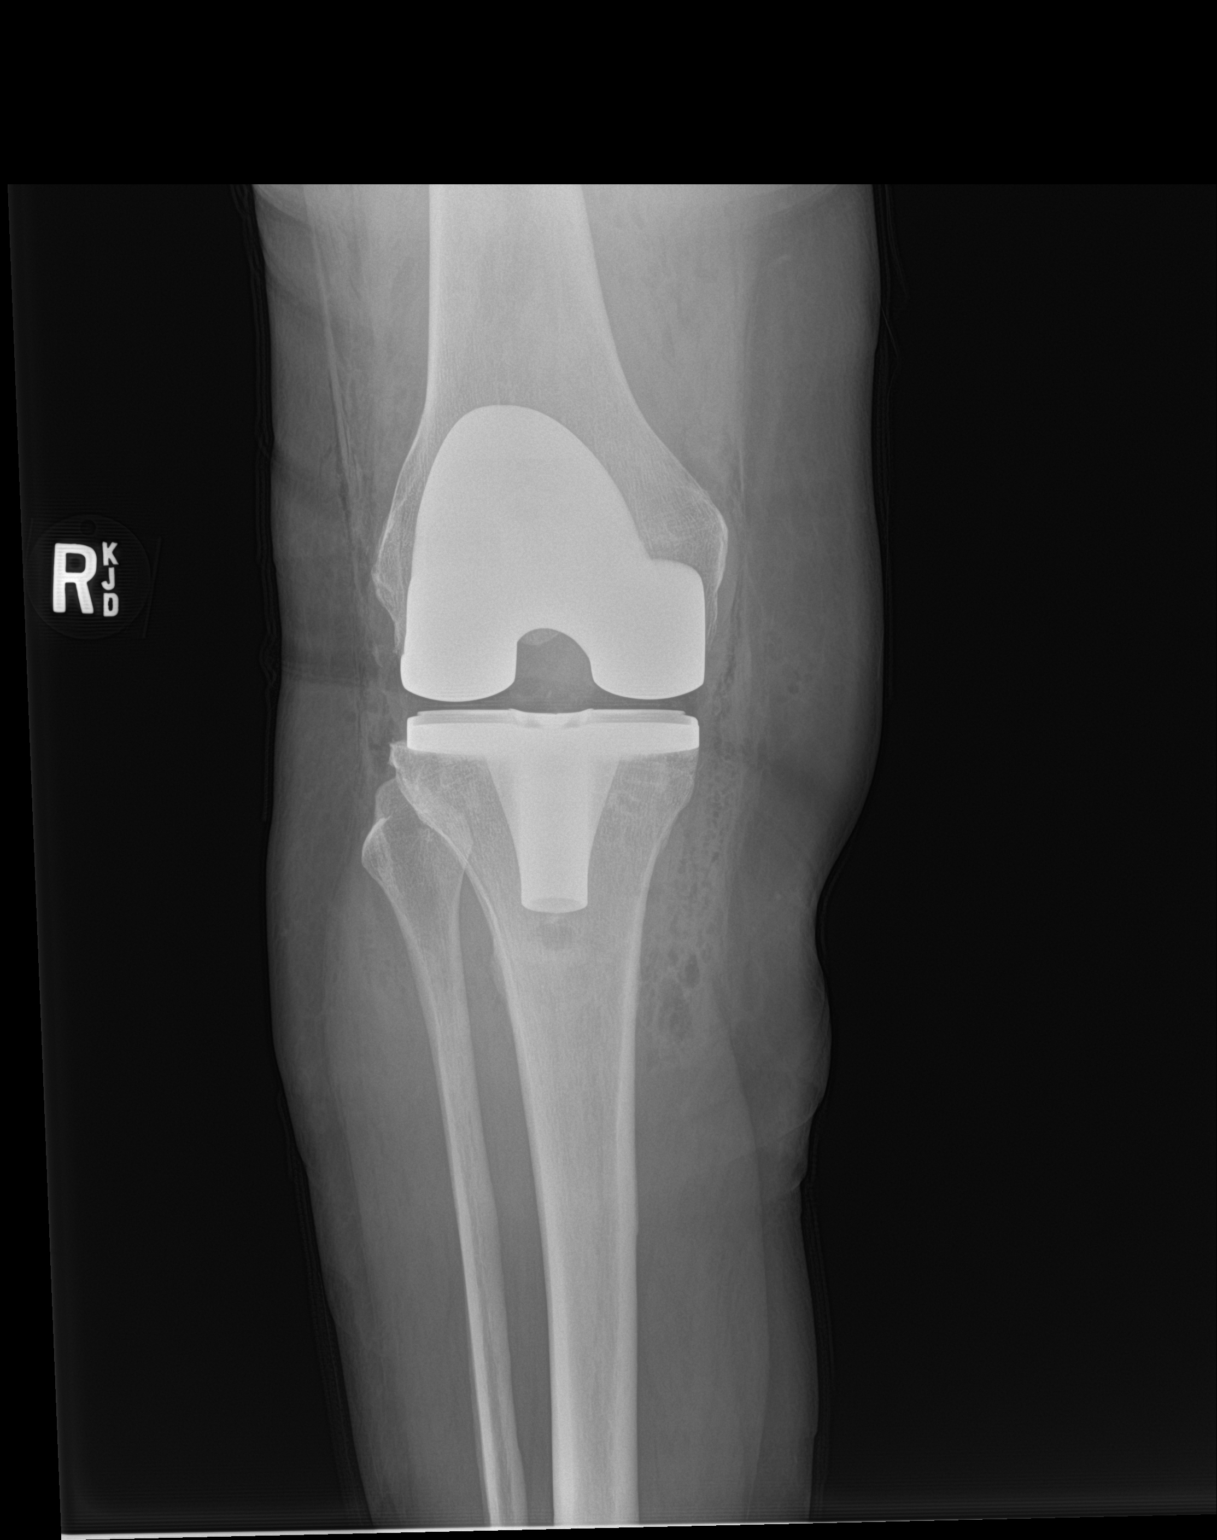

[knee lat]
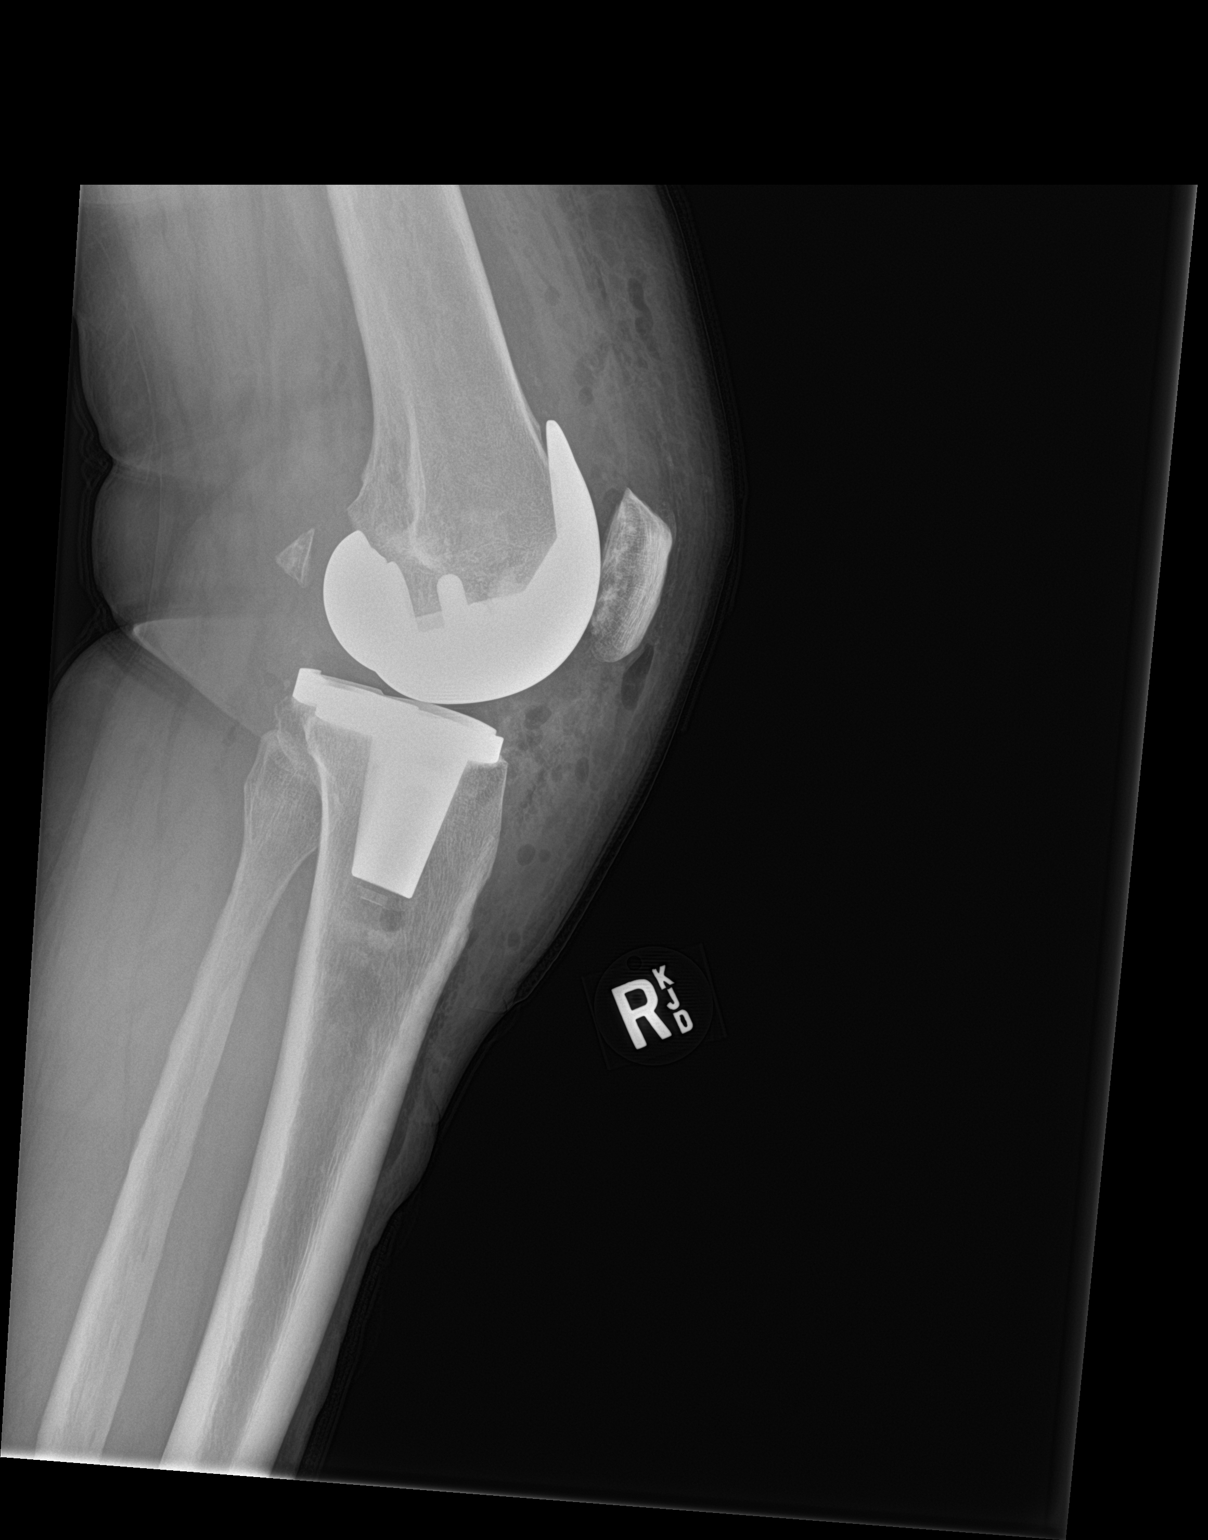

[2 of 2 positions shown; findings below may reference images not displayed]

FINDINGS: RIGHT total knee arthroplasty. No fracture dislocation. Loose body
posterior the joint not changed from comparison MRI.
IMPRESSION: No complication following knee arthroplasty.

## 2019-06-09 ENCOUNTER — Other Ambulatory Visit: Payer: Self-pay | Admitting: Internal Medicine

## 2019-06-09 DIAGNOSIS — Z1231 Encounter for screening mammogram for malignant neoplasm of breast: Secondary | ICD-10-CM

## 2019-07-14 ENCOUNTER — Ambulatory Visit
Admission: RE | Admit: 2019-07-14 | Discharge: 2019-07-14 | Disposition: A | Discharge status: Home / Self Care | Payer: Medicare Other | Source: Ambulatory Visit | Attending: Internal Medicine | Admitting: Internal Medicine

## 2019-07-14 ENCOUNTER — Other Ambulatory Visit: Payer: Self-pay

## 2019-07-14 DIAGNOSIS — Z1231 Encounter for screening mammogram for malignant neoplasm of breast: Secondary | ICD-10-CM

## 2020-06-04 DIAGNOSIS — M19021 Primary osteoarthritis, right elbow: Secondary | ICD-10-CM | POA: Diagnosis not present

## 2020-06-04 DIAGNOSIS — G5621 Lesion of ulnar nerve, right upper limb: Secondary | ICD-10-CM | POA: Diagnosis not present

## 2020-06-04 DIAGNOSIS — M25521 Pain in right elbow: Secondary | ICD-10-CM | POA: Diagnosis not present

## 2020-06-12 ENCOUNTER — Other Ambulatory Visit: Payer: Self-pay | Admitting: Internal Medicine

## 2020-06-12 DIAGNOSIS — Z1231 Encounter for screening mammogram for malignant neoplasm of breast: Secondary | ICD-10-CM

## 2020-07-16 ENCOUNTER — Ambulatory Visit
Admission: RE | Admit: 2020-07-16 | Discharge: 2020-07-16 | Disposition: A | Discharge status: Home / Self Care | Payer: Medicare Other | Source: Ambulatory Visit | Attending: Internal Medicine | Admitting: Internal Medicine

## 2020-07-16 ENCOUNTER — Other Ambulatory Visit: Payer: Self-pay

## 2020-07-16 DIAGNOSIS — Z1231 Encounter for screening mammogram for malignant neoplasm of breast: Secondary | ICD-10-CM | POA: Diagnosis not present

## 2020-11-06 DIAGNOSIS — E039 Hypothyroidism, unspecified: Secondary | ICD-10-CM | POA: Diagnosis not present

## 2020-11-06 DIAGNOSIS — I10 Essential (primary) hypertension: Secondary | ICD-10-CM | POA: Diagnosis not present

## 2020-11-06 DIAGNOSIS — R5383 Other fatigue: Secondary | ICD-10-CM | POA: Diagnosis not present

## 2020-11-06 DIAGNOSIS — Z79899 Other long term (current) drug therapy: Secondary | ICD-10-CM | POA: Diagnosis not present

## 2020-11-06 DIAGNOSIS — E2839 Other primary ovarian failure: Secondary | ICD-10-CM | POA: Diagnosis not present

## 2020-11-06 DIAGNOSIS — E7800 Pure hypercholesterolemia, unspecified: Secondary | ICD-10-CM | POA: Diagnosis not present

## 2020-11-06 DIAGNOSIS — E559 Vitamin D deficiency, unspecified: Secondary | ICD-10-CM | POA: Diagnosis not present

## 2021-06-04 ENCOUNTER — Other Ambulatory Visit: Payer: Self-pay | Admitting: Internal Medicine

## 2021-06-04 DIAGNOSIS — Z1231 Encounter for screening mammogram for malignant neoplasm of breast: Secondary | ICD-10-CM

## 2021-07-30 ENCOUNTER — Ambulatory Visit
Admission: RE | Admit: 2021-07-30 | Discharge: 2021-07-30 | Disposition: A | Discharge status: Home / Self Care | Payer: Medicare Other | Source: Ambulatory Visit

## 2021-07-30 ENCOUNTER — Other Ambulatory Visit: Payer: Self-pay

## 2021-07-30 DIAGNOSIS — Z1231 Encounter for screening mammogram for malignant neoplasm of breast: Secondary | ICD-10-CM | POA: Diagnosis not present

## 2021-11-04 DIAGNOSIS — R5383 Other fatigue: Secondary | ICD-10-CM | POA: Diagnosis not present

## 2021-11-04 DIAGNOSIS — E7800 Pure hypercholesterolemia, unspecified: Secondary | ICD-10-CM | POA: Diagnosis not present

## 2021-11-04 DIAGNOSIS — E039 Hypothyroidism, unspecified: Secondary | ICD-10-CM | POA: Diagnosis not present

## 2021-11-04 DIAGNOSIS — E559 Vitamin D deficiency, unspecified: Secondary | ICD-10-CM | POA: Diagnosis not present

## 2021-11-04 DIAGNOSIS — E2839 Other primary ovarian failure: Secondary | ICD-10-CM | POA: Diagnosis not present

## 2021-11-04 DIAGNOSIS — I10 Essential (primary) hypertension: Secondary | ICD-10-CM | POA: Diagnosis not present

## 2021-11-04 DIAGNOSIS — Z79899 Other long term (current) drug therapy: Secondary | ICD-10-CM | POA: Diagnosis not present

## 2021-11-07 ENCOUNTER — Other Ambulatory Visit (HOSPITAL_BASED_OUTPATIENT_CLINIC_OR_DEPARTMENT_OTHER): Payer: Self-pay | Admitting: Internal Medicine

## 2021-11-07 ENCOUNTER — Other Ambulatory Visit: Payer: Self-pay | Admitting: Internal Medicine

## 2021-11-07 DIAGNOSIS — R0989 Other specified symptoms and signs involving the circulatory and respiratory systems: Secondary | ICD-10-CM

## 2021-11-19 ENCOUNTER — Encounter (HOSPITAL_COMMUNITY): Payer: Self-pay

## 2021-11-19 ENCOUNTER — Ambulatory Visit (HOSPITAL_COMMUNITY)
Admission: RE | Admit: 2021-11-19 | Discharge: 2021-11-19 | Disposition: A | Discharge status: Home / Self Care | Payer: Medicare Other | Source: Ambulatory Visit | Attending: Internal Medicine | Admitting: Internal Medicine

## 2021-11-19 DIAGNOSIS — R911 Solitary pulmonary nodule: Secondary | ICD-10-CM | POA: Diagnosis not present

## 2021-11-19 DIAGNOSIS — R0989 Other specified symptoms and signs involving the circulatory and respiratory systems: Secondary | ICD-10-CM | POA: Diagnosis not present

## 2021-11-19 DIAGNOSIS — R0602 Shortness of breath: Secondary | ICD-10-CM | POA: Diagnosis not present

## 2021-11-19 DIAGNOSIS — R918 Other nonspecific abnormal finding of lung field: Secondary | ICD-10-CM | POA: Diagnosis not present

## 2021-11-19 DIAGNOSIS — R053 Chronic cough: Secondary | ICD-10-CM | POA: Diagnosis not present

## 2021-11-19 DIAGNOSIS — I700 Atherosclerosis of aorta: Secondary | ICD-10-CM | POA: Diagnosis not present

## 2022-05-01 DIAGNOSIS — M25562 Pain in left knee: Secondary | ICD-10-CM | POA: Diagnosis not present

## 2022-05-12 DIAGNOSIS — M25562 Pain in left knee: Secondary | ICD-10-CM | POA: Diagnosis not present

## 2022-06-19 ENCOUNTER — Other Ambulatory Visit: Payer: Self-pay | Admitting: Internal Medicine

## 2022-06-19 DIAGNOSIS — Z1231 Encounter for screening mammogram for malignant neoplasm of breast: Secondary | ICD-10-CM

## 2022-08-01 ENCOUNTER — Ambulatory Visit
Admission: RE | Admit: 2022-08-01 | Discharge: 2022-08-01 | Disposition: A | Discharge status: Home / Self Care | Payer: Medicare Other | Source: Ambulatory Visit

## 2022-08-01 DIAGNOSIS — Z1231 Encounter for screening mammogram for malignant neoplasm of breast: Secondary | ICD-10-CM | POA: Diagnosis not present

## 2022-11-04 DIAGNOSIS — E039 Hypothyroidism, unspecified: Secondary | ICD-10-CM | POA: Diagnosis not present

## 2022-11-04 DIAGNOSIS — Z79899 Other long term (current) drug therapy: Secondary | ICD-10-CM | POA: Diagnosis not present

## 2022-11-04 DIAGNOSIS — R5383 Other fatigue: Secondary | ICD-10-CM | POA: Diagnosis not present

## 2022-11-04 DIAGNOSIS — E559 Vitamin D deficiency, unspecified: Secondary | ICD-10-CM | POA: Diagnosis not present

## 2022-11-04 DIAGNOSIS — E7800 Pure hypercholesterolemia, unspecified: Secondary | ICD-10-CM | POA: Diagnosis not present

## 2022-11-04 DIAGNOSIS — I10 Essential (primary) hypertension: Secondary | ICD-10-CM | POA: Diagnosis not present

## 2022-11-04 DIAGNOSIS — E2839 Other primary ovarian failure: Secondary | ICD-10-CM | POA: Diagnosis not present

## 2023-06-25 ENCOUNTER — Other Ambulatory Visit: Payer: Self-pay | Admitting: Internal Medicine

## 2023-06-25 DIAGNOSIS — Z1231 Encounter for screening mammogram for malignant neoplasm of breast: Secondary | ICD-10-CM

## 2023-08-04 ENCOUNTER — Ambulatory Visit
Admission: RE | Admit: 2023-08-04 | Discharge: 2023-08-04 | Disposition: A | Discharge status: Home / Self Care | Payer: Medicare Other | Source: Ambulatory Visit

## 2023-08-04 DIAGNOSIS — Z1231 Encounter for screening mammogram for malignant neoplasm of breast: Secondary | ICD-10-CM | POA: Diagnosis not present

## 2023-09-14 DIAGNOSIS — E039 Hypothyroidism, unspecified: Secondary | ICD-10-CM | POA: Diagnosis not present

## 2023-09-14 DIAGNOSIS — R5383 Other fatigue: Secondary | ICD-10-CM | POA: Diagnosis not present

## 2023-09-14 DIAGNOSIS — E559 Vitamin D deficiency, unspecified: Secondary | ICD-10-CM | POA: Diagnosis not present

## 2023-09-14 DIAGNOSIS — D649 Anemia, unspecified: Secondary | ICD-10-CM | POA: Diagnosis not present

## 2023-09-14 DIAGNOSIS — I10 Essential (primary) hypertension: Secondary | ICD-10-CM | POA: Diagnosis not present

## 2023-09-14 DIAGNOSIS — E7800 Pure hypercholesterolemia, unspecified: Secondary | ICD-10-CM | POA: Diagnosis not present

## 2023-09-14 DIAGNOSIS — Z79899 Other long term (current) drug therapy: Secondary | ICD-10-CM | POA: Diagnosis not present

## 2023-09-22 ENCOUNTER — Other Ambulatory Visit: Payer: Self-pay | Admitting: Internal Medicine

## 2023-09-22 DIAGNOSIS — R918 Other nonspecific abnormal finding of lung field: Secondary | ICD-10-CM

## 2023-09-25 ENCOUNTER — Ambulatory Visit
Admission: RE | Admit: 2023-09-25 | Discharge: 2023-09-25 | Disposition: A | Discharge status: Home / Self Care | Payer: Medicare Other | Source: Ambulatory Visit | Attending: Internal Medicine | Admitting: Internal Medicine

## 2023-09-25 DIAGNOSIS — R918 Other nonspecific abnormal finding of lung field: Secondary | ICD-10-CM | POA: Diagnosis not present

## 2023-09-25 DIAGNOSIS — I700 Atherosclerosis of aorta: Secondary | ICD-10-CM | POA: Diagnosis not present

## 2024-06-22 ENCOUNTER — Other Ambulatory Visit
Admission: RE | Admit: 2024-06-22 | Discharge: 2024-06-22 | Disposition: A | Discharge status: Home / Self Care | Source: Ambulatory Visit | Attending: Ophthalmology | Admitting: Ophthalmology

## 2024-06-22 DIAGNOSIS — H4921 Sixth [abducent] nerve palsy, right eye: Secondary | ICD-10-CM | POA: Diagnosis not present

## 2024-06-22 DIAGNOSIS — H532 Diplopia: Secondary | ICD-10-CM | POA: Diagnosis not present

## 2024-06-22 DIAGNOSIS — R519 Headache, unspecified: Secondary | ICD-10-CM | POA: Diagnosis not present

## 2024-06-22 LAB — C-REACTIVE PROTEIN: CRP: <0.5 mg/dL (ref <1.0)

## 2024-06-22 LAB — SEDIMENTATION RATE: Sed Rate: 4 mm/h (ref 0–30)

## 2024-06-22 LAB — PLATELET COUNT: Platelets: 268 10*3/uL (ref 150–400)

## 2024-06-28 ENCOUNTER — Other Ambulatory Visit: Payer: Self-pay | Admitting: Internal Medicine

## 2024-06-28 DIAGNOSIS — Z1231 Encounter for screening mammogram for malignant neoplasm of breast: Secondary | ICD-10-CM

## 2024-07-15 DIAGNOSIS — H532 Diplopia: Secondary | ICD-10-CM | POA: Diagnosis not present

## 2024-07-18 DIAGNOSIS — I10 Essential (primary) hypertension: Secondary | ICD-10-CM | POA: Diagnosis not present

## 2024-07-18 DIAGNOSIS — E2839 Other primary ovarian failure: Secondary | ICD-10-CM | POA: Diagnosis not present

## 2024-07-18 DIAGNOSIS — Z79899 Other long term (current) drug therapy: Secondary | ICD-10-CM | POA: Diagnosis not present

## 2024-07-18 DIAGNOSIS — R5383 Other fatigue: Secondary | ICD-10-CM | POA: Diagnosis not present

## 2024-07-20 DIAGNOSIS — H43812 Vitreous degeneration, left eye: Secondary | ICD-10-CM | POA: Diagnosis not present

## 2024-07-20 DIAGNOSIS — H4921 Sixth [abducent] nerve palsy, right eye: Secondary | ICD-10-CM | POA: Diagnosis not present

## 2024-07-20 DIAGNOSIS — H2513 Age-related nuclear cataract, bilateral: Secondary | ICD-10-CM | POA: Diagnosis not present

## 2024-07-20 DIAGNOSIS — H532 Diplopia: Secondary | ICD-10-CM | POA: Diagnosis not present

## 2024-08-08 ENCOUNTER — Ambulatory Visit
Admission: RE | Admit: 2024-08-08 | Discharge: 2024-08-08 | Disposition: A | Discharge status: Home / Self Care | Source: Ambulatory Visit

## 2024-08-08 DIAGNOSIS — Z1231 Encounter for screening mammogram for malignant neoplasm of breast: Secondary | ICD-10-CM
